# Patient Record
Sex: Female | Born: 1945 | Race: White | Hispanic: No | Smoking: Never smoker
Health system: Southern US, Community
[De-identification: ages and names within clinical notes are randomized; demographics above are authoritative.]

## PROBLEM LIST (undated history)

## (undated) DIAGNOSIS — M8580 Other specified disorders of bone density and structure, unspecified site: Secondary | ICD-10-CM

## (undated) DIAGNOSIS — M719 Bursopathy, unspecified: Secondary | ICD-10-CM

## (undated) DIAGNOSIS — D649 Anemia, unspecified: Secondary | ICD-10-CM

## (undated) DIAGNOSIS — F419 Anxiety disorder, unspecified: Secondary | ICD-10-CM

## (undated) DIAGNOSIS — Z8619 Personal history of other infectious and parasitic diseases: Secondary | ICD-10-CM

## (undated) DIAGNOSIS — C801 Malignant (primary) neoplasm, unspecified: Secondary | ICD-10-CM

## (undated) DIAGNOSIS — H269 Unspecified cataract: Secondary | ICD-10-CM

## (undated) DIAGNOSIS — K219 Gastro-esophageal reflux disease without esophagitis: Secondary | ICD-10-CM

## (undated) HISTORY — DX: Unspecified cataract: H26.9

## (undated) HISTORY — PX: CATARACT EXTRACTION: SUR2

## (undated) HISTORY — DX: Anemia, unspecified: D64.9

## (undated) HISTORY — DX: Anxiety disorder, unspecified: F41.9

## (undated) HISTORY — DX: Bursopathy, unspecified: M71.9

## (undated) HISTORY — PX: OTHER SURGICAL HISTORY: SHX169

## (undated) HISTORY — DX: Malignant (primary) neoplasm, unspecified: C80.1

## (undated) HISTORY — PX: DILATION AND CURETTAGE OF UTERUS: SHX78

## (undated) HISTORY — DX: Other specified disorders of bone density and structure, unspecified site: M85.80

## (undated) HISTORY — PX: BREAST LUMPECTOMY: SHX2

## (undated) HISTORY — DX: Personal history of other infectious and parasitic diseases: Z86.19

## (undated) HISTORY — PX: TUBAL LIGATION: SHX77

## (undated) HISTORY — DX: Gastro-esophageal reflux disease without esophagitis: K21.9

## (undated) HISTORY — PX: COLONOSCOPY: SHX174

---

## 1998-12-07 ENCOUNTER — Other Ambulatory Visit: Admission: RE | Admit: 1998-12-07 | Discharge: 1998-12-07 | Payer: Self-pay | Admitting: Obstetrics and Gynecology

## 1999-12-07 ENCOUNTER — Other Ambulatory Visit: Admission: RE | Admit: 1999-12-07 | Discharge: 1999-12-07 | Payer: Self-pay | Admitting: Obstetrics and Gynecology

## 2000-12-12 ENCOUNTER — Other Ambulatory Visit: Admission: RE | Admit: 2000-12-12 | Discharge: 2000-12-12 | Payer: Self-pay | Admitting: Obstetrics and Gynecology

## 2001-12-14 ENCOUNTER — Other Ambulatory Visit: Admission: RE | Admit: 2001-12-14 | Discharge: 2001-12-14 | Payer: Self-pay | Admitting: Obstetrics and Gynecology

## 2003-01-07 ENCOUNTER — Other Ambulatory Visit: Admission: RE | Admit: 2003-01-07 | Discharge: 2003-01-07 | Payer: Self-pay | Admitting: Obstetrics and Gynecology

## 2004-01-10 ENCOUNTER — Other Ambulatory Visit: Admission: RE | Admit: 2004-01-10 | Discharge: 2004-01-10 | Payer: Self-pay | Admitting: Obstetrics and Gynecology

## 2005-01-31 ENCOUNTER — Other Ambulatory Visit: Admission: RE | Admit: 2005-01-31 | Discharge: 2005-01-31 | Payer: Self-pay | Admitting: Obstetrics and Gynecology

## 2005-03-15 ENCOUNTER — Ambulatory Visit: Payer: Self-pay | Admitting: Internal Medicine

## 2005-03-21 ENCOUNTER — Ambulatory Visit: Payer: Self-pay | Admitting: Internal Medicine

## 2005-03-21 ENCOUNTER — Encounter (INDEPENDENT_AMBULATORY_CARE_PROVIDER_SITE_OTHER): Payer: Self-pay | Admitting: *Deleted

## 2006-02-03 ENCOUNTER — Other Ambulatory Visit: Admission: RE | Admit: 2006-02-03 | Discharge: 2006-02-03 | Payer: Self-pay | Admitting: Obstetrics and Gynecology

## 2007-02-11 ENCOUNTER — Other Ambulatory Visit: Admission: RE | Admit: 2007-02-11 | Discharge: 2007-02-11 | Payer: Self-pay | Admitting: Obstetrics and Gynecology

## 2008-02-15 ENCOUNTER — Ambulatory Visit: Payer: Self-pay | Admitting: Obstetrics and Gynecology

## 2008-02-15 ENCOUNTER — Other Ambulatory Visit: Admission: RE | Admit: 2008-02-15 | Discharge: 2008-02-15 | Payer: Self-pay | Admitting: Obstetrics and Gynecology

## 2008-02-15 ENCOUNTER — Encounter: Payer: Self-pay | Admitting: Obstetrics and Gynecology

## 2008-04-01 DIAGNOSIS — Z8619 Personal history of other infectious and parasitic diseases: Secondary | ICD-10-CM

## 2008-04-01 HISTORY — DX: Personal history of other infectious and parasitic diseases: Z86.19

## 2009-04-13 ENCOUNTER — Ambulatory Visit: Payer: Self-pay | Admitting: Obstetrics and Gynecology

## 2009-04-13 ENCOUNTER — Other Ambulatory Visit: Admission: RE | Admit: 2009-04-13 | Discharge: 2009-04-13 | Payer: Self-pay | Admitting: Obstetrics and Gynecology

## 2010-04-05 ENCOUNTER — Encounter (INDEPENDENT_AMBULATORY_CARE_PROVIDER_SITE_OTHER): Payer: Self-pay | Admitting: *Deleted

## 2010-05-03 NOTE — Letter (Signed)
Summary: Colonoscopy Date Change Letter  [facility]  [address] [place], [place] [address]   Phone: [phone]  Fax: [phone]      April 05, 2010 MRN: [id number]   [facility] [address] [place], [place]  [address]   Dear Debra Frederick,   Previously you were recommended to have a repeat colonoscopy around this time. Your chart was recently reviewed by Dr. Lina Sar of [facility]. Follow up colonoscopy is now recommended in December 2013. This revised recommendation is based on current, nationally recognized guidelines for colorectal cancer screening and polyp surveillance. These guidelines are endorsed by the American Cancer Society, [employer] on Colorectal Cancer as well as numerous other major medical organizations.  Please understand that our recommendation assumes that you do not have any new symptoms such as bleeding, a change in bowel habits, anemia, or significant abdominal discomfort. If you do have any concerning GI symptoms or want to discuss the guideline recommendations, please call to arrange an office visit at your earliest convenience. Otherwise we will keep you in our reminder system and contact you 1-2 months prior to the date listed above to schedule your next colonoscopy.  Thank you,  Hedwig Morton. Juanda Chance, M.D.  [facility] [phone]

## 2010-07-17 ENCOUNTER — Encounter (INDEPENDENT_AMBULATORY_CARE_PROVIDER_SITE_OTHER): Payer: Medicare Other | Admitting: Obstetrics and Gynecology

## 2010-07-17 ENCOUNTER — Other Ambulatory Visit: Payer: Self-pay | Admitting: Obstetrics and Gynecology

## 2010-07-17 ENCOUNTER — Other Ambulatory Visit (HOSPITAL_COMMUNITY)
Admission: RE | Admit: 2010-07-17 | Discharge: 2010-07-17 | Disposition: A | Discharge status: Home / Self Care | Payer: Medicare Other | Source: Ambulatory Visit | Attending: Obstetrics and Gynecology | Admitting: Obstetrics and Gynecology

## 2010-07-17 DIAGNOSIS — N951 Menopausal and female climacteric states: Secondary | ICD-10-CM

## 2010-07-17 DIAGNOSIS — M899 Disorder of bone, unspecified: Secondary | ICD-10-CM

## 2010-07-17 DIAGNOSIS — Z124 Encounter for screening for malignant neoplasm of cervix: Secondary | ICD-10-CM

## 2010-07-17 DIAGNOSIS — I10 Essential (primary) hypertension: Secondary | ICD-10-CM

## 2010-07-17 DIAGNOSIS — N952 Postmenopausal atrophic vaginitis: Secondary | ICD-10-CM

## 2010-12-05 ENCOUNTER — Other Ambulatory Visit: Payer: Self-pay | Admitting: Dermatology

## 2011-04-12 ENCOUNTER — Encounter: Payer: Self-pay | Admitting: Obstetrics and Gynecology

## 2011-10-22 ENCOUNTER — Encounter: Payer: Self-pay | Admitting: Emergency Medicine

## 2011-10-22 ENCOUNTER — Ambulatory Visit (INDEPENDENT_AMBULATORY_CARE_PROVIDER_SITE_OTHER): Payer: Medicare Other | Admitting: Emergency Medicine

## 2011-10-22 ENCOUNTER — Ambulatory Visit: Payer: Medicare Other

## 2011-10-22 ENCOUNTER — Other Ambulatory Visit: Payer: Self-pay

## 2011-10-22 VITALS — BP 137/84 | HR 65 | Temp 97.2°F | Resp 16 | Ht 68.5 in | Wt 160.0 lb

## 2011-10-22 DIAGNOSIS — R634 Abnormal weight loss: Secondary | ICD-10-CM

## 2011-10-22 DIAGNOSIS — M542 Cervicalgia: Secondary | ICD-10-CM

## 2011-10-22 DIAGNOSIS — Z0000 Encounter for general adult medical examination without abnormal findings: Secondary | ICD-10-CM

## 2011-10-22 DIAGNOSIS — E785 Hyperlipidemia, unspecified: Secondary | ICD-10-CM

## 2011-10-22 DIAGNOSIS — E559 Vitamin D deficiency, unspecified: Secondary | ICD-10-CM | POA: Insufficient documentation

## 2011-10-22 LAB — POCT UA - MICROSCOPIC ONLY
Bacteria, U Microscopic: NEGATIVE
Casts, Ur, LPF, POC: NEGATIVE
Crystals, Ur, HPF, POC: NEGATIVE
RBC, urine, microscopic: NEGATIVE

## 2011-10-22 LAB — CBC WITH DIFFERENTIAL/PLATELET
Basophils Absolute: 0.1 10*3/uL (ref 0.0–0.1)
Basophils Relative: 1 % (ref 0–1)
HCT: 40.8 % (ref 36.0–46.0)
MCHC: 34.3 g/dL (ref 30.0–36.0)
Monocytes Absolute: 0.3 10*3/uL (ref 0.1–1.0)
Neutro Abs: 2.8 10*3/uL (ref 1.7–7.7)
Platelets: 216 10*3/uL (ref 150–400)
RDW: 14.2 % (ref 11.5–15.5)

## 2011-10-22 LAB — COMPREHENSIVE METABOLIC PANEL
ALT: 13 U/L (ref 0–35)
BUN: 17 mg/dL (ref 6–23)
CO2: 24 mEq/L (ref 19–32)
Creat: 0.82 mg/dL (ref 0.50–1.10)
Glucose, Bld: 91 mg/dL (ref 70–99)
Total Bilirubin: 0.6 mg/dL (ref 0.3–1.2)

## 2011-10-22 LAB — POCT URINALYSIS DIPSTICK
Bilirubin, UA: NEGATIVE
Blood, UA: NEGATIVE
Ketones, UA: NEGATIVE
Spec Grav, UA: 1.02
pH, UA: 7

## 2011-10-22 LAB — LIPID PANEL
Cholesterol: 222 mg/dL — ABNORMAL HIGH (ref 0–200)
HDL: 60 mg/dL (ref >39)
Total CHOL/HDL Ratio: 3.7 Ratio
Triglycerides: 145 mg/dL (ref <150)

## 2011-10-22 LAB — TSH: TSH: 2.196 u[IU]/mL (ref 0.350–4.500)

## 2011-10-22 NOTE — Progress Notes (Signed)
  Subjective:    Patient ID: Debra Frederick, female    DOB: 1945/10/08, 66 y.o.   MRN: [id number]  HPI    Review of Systems     Objective:   Physical Exam  [facility] reading (PRIMARY) by  Dr Cleta Alberts no acute disease normal alignment no disc space disease        Assessment & Plan:

## 2011-10-22 NOTE — Progress Notes (Signed)
@UMFCLOGO @  Patient ID: Debra Frederick MRN: [id number], DOB: 02/24/1946, 66 y.o. Date of Encounter: 10/22/2011, 9:32 AM  Primary Physician: No primary provider on file.  Chief Complaint: Physical (CPE)  HPI: 66 y.o. y/o female with history of noted below here for CPE.  Doing well. No issues/complaints.  NWG:[id number]  Pap:2012 MMG: Review of Systems:  Consitutional: No fever, chills, fatigue, night sweats, lymphadenopathy, or weight changes. Eyes: No visual changes, eye redness, or discharge. ENT/Mouth: Ears: No otalgia, tinnitus, hearing loss, discharge. Nose: No congestion, rhinorrhea, sinus pain, or epistaxis. Throat: No sore throat, post nasal drip, or teeth pain. Cardiovascular: No CP, palpitations, diaphoresis, DOE, edema, orthopnea, PND. Respiratory: She has a history of exercise-induced asthma she has Dulera to take but rarely takes it. As a pro-air inhaler to use as  Gastrointestinal: No anorexia, dysphagia, reflux, pain, nausea, vomiting, hematemesis, diarrhea, constipation, BRBPR, or melena. Breast: No discharge, pain, swelling, or mass. Genitourinary: No dysuria, frequency, urgency, hematuria, incontinence, nocturia, amenorrhea, vaginal discharge, pruritis, burning, abnormal bleeding, or pain. Musculoskeletal: She has experienced some pain in the right side of her neck no radiation down the arm.  Skin: No rash, erythema, lesion changes, pain, warmth, jaundice, or pruritis. Neurological: No headache, dizziness, syncope, seizures, tremors, memory loss, coordination problems, or paresthesias. Psychological: She has had some difficulty with anxiety. When she does get anxious she treats this with exercise and we'll get the No  depression, hallucinations, SI/HI. Endocrine: No fatigue, polydipsia, polyphagia, polyuria, or known diabetes. All other systems were reviewed and are otherwise negative.  Past Medical History  Diagnosis Date  . Bursitis   . Vitamin d deficiency     . History of shingles 2010     Past Surgical History  Procedure Date  . Dilation and curettage of uterus   . Breast lumpectomy   . Tubal ligation     Home Meds:  Prior to Admission medications   Medication Sig Start Date End Date Taking? Authorizing Provider  calcium-vitamin D (OSCAL WITH D) 250-125 MG-UNIT per tablet Take 1 tablet by mouth daily.   Yes Historical Provider, MD  ergocalciferol (VITAMIN D2) 50000 UNITS capsule Take 50,000 Units by mouth once a week.   Yes Historical Provider, MD  fish oil-omega-3 fatty acids 1000 MG capsule Take 1 g by mouth daily.   Yes Historical Provider, MD  Flaxseed, Linseed, (FLAX SEED OIL PO) Take by mouth.   Yes Historical Provider, MD    Allergies:  Allergies  Allergen Reactions  . Penicillins     History   Social History  . Marital Status: Married    Spouse Name: N/A    Number of Children: N/A  . Years of Education: N/A   Occupational History  . Not on file.   Social History Main Topics  . Smoking status: Never Smoker   . Smokeless tobacco: Not on file  . Alcohol Use: Not on file  . Drug Use: Not on file  . Sexually Active: Not on file   Other Topics Concern  . Not on file   Social History Narrative  . No narrative on file    Family History  Problem Relation Age of Onset  . Cancer Mother   . Cancer Father     Physical Exam  Blood pressure 137/84, pulse 65, temperature 97.2 F (36.2 C), resp. rate 16, height 5' 8.5" (1.74 m), weight 160 lb (72.576 kg)., Body mass index is 23.97 kg/(m^2). General: Well developed, well nourished, in no acute distress. HEENT:  Normocephalic, atraumatic. Conjunctiva pink, sclera non-icteric. Pupils 2 mm constricting to 1 mm, round, regular, and equally reactive to light and accomodation. EOMI. Internal auditory canal clear. TMs with good cone of light and without pathology. Nasal mucosa pink. Nares are without discharge. No sinus tenderness. Oral mucosa pink. Dentition  normal.  Pharynx without exudate.   Neck: Supple. Trachea midline. No thyromegaly. Full ROM. No lymphadenopathy. Lungs: Clear to auscultation bilaterally without wheezes, rales, or rhonchi. Breathing is of normal effort and unlabored. Cardiovascular: RRR with S1 S2. No murmurs, rubs, or gallops appreciated. Distal pulses 2+ symmetrically. No carotid or abdominal bruits  Breast: Not examined    Abdomen: Soft, non-tender, non-distended with normoactive bowel sounds. No hepatosplenomegaly or masses. No rebound/guarding. No CVA tenderness. Without hernias.  Genitourinary:     Musculoskeletal: Full range of motion and 5/5 strength throughout. Without swelling, atrophy, tenderness, crepitus, or warmth. Extremities without clubbing, cyanosis, or edema. Calves supple. Skin: Warm and moist without erythema, ecchymosis, wounds, or rash. Neuro: A+Ox3. CN II-XII grossly intact. Moves all extremities spontaneously. Full sensation throughout. Normal gait. DTR 2+ throughout upper and lower extremities. Finger to nose intact. Psych:  Responds to questions appropriately with a normal affect.   The depression scale score of four     Assessment/Plan:  66 y.o. y/o female here for CPE. Patient is healthy she does not smoke or drink. She exercises regularly. She continues to work 15-20 hours a week. She is due to see her GYN doctor Dr. Eda Paschal in the next month.  -  Signed, Earl Lites, MD 10/22/2011 9:32 AM

## 2011-10-24 NOTE — Progress Notes (Signed)
Patient advised of lab results

## 2011-11-04 ENCOUNTER — Encounter: Payer: Self-pay | Admitting: Gynecology

## 2011-11-04 DIAGNOSIS — M8580 Other specified disorders of bone density and structure, unspecified site: Secondary | ICD-10-CM | POA: Insufficient documentation

## 2011-11-13 ENCOUNTER — Encounter: Payer: Self-pay | Admitting: Obstetrics and Gynecology

## 2011-11-13 ENCOUNTER — Ambulatory Visit (INDEPENDENT_AMBULATORY_CARE_PROVIDER_SITE_OTHER): Payer: Medicare Other | Admitting: Obstetrics and Gynecology

## 2011-11-13 VITALS — BP 130/84 | Ht 68.0 in | Wt 158.0 lb

## 2011-11-13 DIAGNOSIS — N952 Postmenopausal atrophic vaginitis: Secondary | ICD-10-CM

## 2011-11-13 DIAGNOSIS — R351 Nocturia: Secondary | ICD-10-CM

## 2011-11-13 DIAGNOSIS — Z780 Asymptomatic menopausal state: Secondary | ICD-10-CM

## 2011-11-13 DIAGNOSIS — M719 Bursopathy, unspecified: Secondary | ICD-10-CM | POA: Insufficient documentation

## 2011-11-13 DIAGNOSIS — M899 Disorder of bone, unspecified: Secondary | ICD-10-CM

## 2011-11-13 DIAGNOSIS — M8580 Other specified disorders of bone density and structure, unspecified site: Secondary | ICD-10-CM

## 2011-11-13 DIAGNOSIS — Z8619 Personal history of other infectious and parasitic diseases: Secondary | ICD-10-CM | POA: Insufficient documentation

## 2011-11-13 NOTE — Progress Notes (Signed)
Patient came to see me today for further followup. We have been watching her with osteopenia. She does not have an elevated FRAX risk. She has not had a fracture. Her last bone density was 2012. She is very mild menopausal symptoms. She is not symptomatic enough to go on HRT. She also had a mother who had breast cancer and that makes her reluctant to take estrogen. She is not aware of vaginal dryness. She is having no vaginal bleeding. She is having no pelvic pain. She is due for followup colonoscopy this year. She has always had normal Pap smears except one in the 90s when she had a colposcopy which was negative and she's had normal Paps since then. Her last Pap was last year. She does have nocturia 1-2 times per night. If she drinks a lot of liquids she did have some urgency as well. She does not have incontinence.  ROS: 12 systems review done. Pertinent positives above. Other positives include bursitis and shingles.  Physical examination: Debra Frederick present. HEENT within normal limits. Neck: Thyroid not large. No masses. Supraclavicular nodes: not enlarged. Breasts: Examined in both sitting and lying  position. No skin changes and no masses. Abdomen: Soft no guarding rebound or masses or hernia. Pelvic: External: Within normal limits. BUS: Within normal limits. Vaginal:within normal limits. Poor  estrogen effect. No evidence of cystocele rectocele or enterocele. Cervix: clean. Uterus: Normal size and shape. Adnexa: No masses. Rectovaginal exam: Confirmatory and negative. Extremities: Within normal limits.  Assessment: #1. Osteopenia #2. Asymptomatic atrophic vaginitis #3. Nocturia with occasional urgency  Plan: Continue periodic bone densities. Continue yearly mammograms. Observation of urinary symptoms.The new Pap smear guidelines were discussed with the patient. No pap done.

## 2011-11-13 NOTE — Patient Instructions (Signed)
Schedule bone density in 2014. 

## 2011-11-20 ENCOUNTER — Encounter: Payer: Self-pay | Admitting: Obstetrics and Gynecology

## 2012-01-02 ENCOUNTER — Encounter: Payer: Self-pay | Admitting: *Deleted

## 2012-03-02 ENCOUNTER — Ambulatory Visit: Payer: Medicare Other

## 2012-03-02 ENCOUNTER — Ambulatory Visit (INDEPENDENT_AMBULATORY_CARE_PROVIDER_SITE_OTHER): Payer: Medicare Other | Admitting: Emergency Medicine

## 2012-03-02 VITALS — BP 139/85 | HR 86 | Temp 97.8°F | Resp 16 | Ht 69.0 in | Wt 159.8 lb

## 2012-03-02 DIAGNOSIS — M79609 Pain in unspecified limb: Secondary | ICD-10-CM

## 2012-03-02 DIAGNOSIS — J029 Acute pharyngitis, unspecified: Secondary | ICD-10-CM

## 2012-03-02 DIAGNOSIS — M79605 Pain in left leg: Secondary | ICD-10-CM

## 2012-03-02 MED ORDER — FIRST-DUKES MOUTHWASH MT SUSP: OROMUCOSAL | Status: DC

## 2012-03-02 NOTE — Progress Notes (Signed)
  Subjective:    Patient ID: Debra Frederick, female    DOB: January 15, 1946, 66 y.o.   MRN: [id number]  HPI patient states she has had some soreness in her leg for the last few weeks. She walks on a regular basis. She recently went to [place] and [place] and walked consistently while she was there on her trip. She now has pain in the upper lateral portion of her left leg she has significant difficulty when she tries to walk.   Review of Systems she is a non smoker. Patient also complaining of chronic sore throat for which he uses a Dukes mouthwash with good relief..     Objective:   Physical Exam examination left leg reveals no calf swelling. There is tenderness just lateral to the tibia over the midshaft of the fibula. There is no posterior calf tenderness. There is a negative Homans sign. There are a few varicosities present.  [facility] reading (PRIMARY) by  Dr Cleta Alberts no fracture seen       Assessment & Plan:  Go ahead and check films of the left tibia and fibula.  I do not see a stress fracture on her films but her exam is stable as a stress injury to the fibula. We'll place the patient on crutches .appointment to be made with orthopedist. She will continue on nonsteroidals.

## 2012-03-16 ENCOUNTER — Other Ambulatory Visit: Payer: Self-pay | Admitting: Radiology

## 2012-03-27 ENCOUNTER — Encounter: Payer: Self-pay | Admitting: Internal Medicine

## 2012-11-26 ENCOUNTER — Encounter: Payer: Self-pay | Admitting: Internal Medicine

## 2013-04-01 HISTORY — PX: COLONOSCOPY: SHX174

## 2013-05-07 ENCOUNTER — Other Ambulatory Visit: Payer: Self-pay | Admitting: *Deleted

## 2013-05-07 DIAGNOSIS — M8580 Other specified disorders of bone density and structure, unspecified site: Secondary | ICD-10-CM

## 2013-05-10 ENCOUNTER — Encounter: Payer: Self-pay | Admitting: Gynecology

## 2013-05-12 ENCOUNTER — Other Ambulatory Visit: Payer: Self-pay | Admitting: *Deleted

## 2013-05-12 DIAGNOSIS — R928 Other abnormal and inconclusive findings on diagnostic imaging of breast: Secondary | ICD-10-CM

## 2013-05-13 ENCOUNTER — Encounter: Payer: Self-pay | Admitting: Emergency Medicine

## 2013-05-13 ENCOUNTER — Ambulatory Visit (INDEPENDENT_AMBULATORY_CARE_PROVIDER_SITE_OTHER): Payer: Medicare Other | Admitting: Emergency Medicine

## 2013-05-13 ENCOUNTER — Encounter: Payer: Self-pay | Admitting: Internal Medicine

## 2013-05-13 ENCOUNTER — Telehealth: Payer: Self-pay | Admitting: Gynecology

## 2013-05-13 ENCOUNTER — Other Ambulatory Visit: Payer: Self-pay | Admitting: *Deleted

## 2013-05-13 VITALS — BP 124/78 | HR 74 | Temp 97.9°F | Resp 16 | Ht 68.5 in | Wt 171.0 lb

## 2013-05-13 DIAGNOSIS — M8580 Other specified disorders of bone density and structure, unspecified site: Secondary | ICD-10-CM

## 2013-05-13 DIAGNOSIS — R9431 Abnormal electrocardiogram [ECG] [EKG]: Secondary | ICD-10-CM

## 2013-05-13 DIAGNOSIS — Z1211 Encounter for screening for malignant neoplasm of colon: Secondary | ICD-10-CM

## 2013-05-13 DIAGNOSIS — Z0000 Encounter for general adult medical examination without abnormal findings: Secondary | ICD-10-CM

## 2013-05-13 DIAGNOSIS — R928 Other abnormal and inconclusive findings on diagnostic imaging of breast: Secondary | ICD-10-CM

## 2013-05-13 DIAGNOSIS — Z139 Encounter for screening, unspecified: Secondary | ICD-10-CM

## 2013-05-13 LAB — CBC WITH DIFFERENTIAL/PLATELET
BASOS ABS: 0.1 10*3/uL (ref 0.0–0.1)
Basophils Relative: 1 % (ref 0–1)
EOS PCT: 3 % (ref 0–5)
Eosinophils Absolute: 0.1 10*3/uL (ref 0.0–0.7)
HCT: 40.9 % (ref 36.0–46.0)
Hemoglobin: 13.8 g/dL (ref 12.0–15.0)
LYMPHS ABS: 1.8 10*3/uL (ref 0.7–4.0)
LYMPHS PCT: 33 % (ref 12–46)
MCH: 28.3 pg (ref 26.0–34.0)
MCHC: 33.7 g/dL (ref 30.0–36.0)
MCV: 83.8 fL (ref 78.0–100.0)
Monocytes Absolute: 0.4 10*3/uL (ref 0.1–1.0)
Monocytes Relative: 8 % (ref 3–12)
NEUTROS PCT: 55 % (ref 43–77)
Neutro Abs: 3 10*3/uL (ref 1.7–7.7)
PLATELETS: 224 10*3/uL (ref 150–400)
RBC: 4.88 MIL/uL (ref 3.87–5.11)
RDW: 13.8 % (ref 11.5–15.5)
WBC: 5.3 10*3/uL (ref 4.0–10.5)

## 2013-05-13 LAB — COMPLETE METABOLIC PANEL WITH GFR
ALK PHOS: 78 U/L (ref 39–117)
ALT: 9 U/L (ref 0–35)
AST: 14 U/L (ref 0–37)
Albumin: 4.3 g/dL (ref 3.5–5.2)
BUN: 19 mg/dL (ref 6–23)
CALCIUM: 9.4 mg/dL (ref 8.4–10.5)
CO2: 23 mEq/L (ref 19–32)
CREATININE: 0.84 mg/dL (ref 0.50–1.10)
Chloride: 106 mEq/L (ref 96–112)
GFR, Est African American: 82 mL/min
GFR, Est Non African American: 71 mL/min
Glucose, Bld: 98 mg/dL (ref 70–99)
Potassium: 4 mEq/L (ref 3.5–5.3)
Sodium: 140 mEq/L (ref 135–145)
Total Bilirubin: 0.6 mg/dL (ref 0.2–1.2)
Total Protein: 6.8 g/dL (ref 6.0–8.3)

## 2013-05-13 LAB — POCT URINALYSIS DIPSTICK
Bilirubin, UA: NEGATIVE
Blood, UA: NEGATIVE
Glucose, UA: NEGATIVE
Ketones, UA: NEGATIVE
Leukocytes, UA: NEGATIVE
Nitrite, UA: NEGATIVE
PH UA: 6
Protein, UA: NEGATIVE
Spec Grav, UA: 1.025
UROBILINOGEN UA: 0.2

## 2013-05-13 LAB — LIPID PANEL
CHOL/HDL RATIO: 2.7 ratio
Cholesterol: 193 mg/dL (ref 0–200)
HDL: 72 mg/dL (ref >39)
LDL CALC: 102 mg/dL — AB (ref 0–99)
TRIGLYCERIDES: 96 mg/dL (ref <150)
VLDL: 19 mg/dL (ref 0–40)

## 2013-05-13 LAB — TSH: TSH: 2.24 u[IU]/mL (ref 0.350–4.500)

## 2013-05-13 NOTE — Progress Notes (Deleted)
   Subjective:    Patient ID: Debra Frederick, female    DOB: 04/30/1945, 67 y.o.   MRN: [id number]  HPI    Review of Systems     Objective:   Physical Exam        Assessment & Plan:   

## 2013-05-13 NOTE — Patient Instructions (Signed)
You will get a call from cardiology regarding appointment. You should make appointment with the dermatologist, Dr Ronnald Ramp. See Dr Phineas Real as scheduled. You will also get a call from Dr Olevia Perches regarding your colonoscopy. We will call you in the next week regarding your lab results. Bring [place] back the stool cards and these can be resulted by the lab.

## 2013-05-13 NOTE — Telephone Encounter (Signed)
Pt informed with the below note. 

## 2013-05-13 NOTE — Telephone Encounter (Signed)
Tell patient bone density did show some loss of bone. Not to the degree that I would recommend taking a medication to treat it. I would make sure that she has had a recent vitamin D level to make sure that she is getting enough vitamin D.

## 2013-05-13 NOTE — Progress Notes (Signed)
   Subjective:    Patient ID: Debra Frederick, female    DOB: 1945-09-03, 68 y.o.   MRN: [id number]  HPI    Review of Systems  HENT: Positive for dental problem and mouth sores.   Musculoskeletal: Positive for back pain.       Objective:   Physical Exam        Assessment & Plan:

## 2013-05-13 NOTE — Progress Notes (Signed)
@[facility]GO @  Patient ID: Debra Frederick MRN: [id number], DOB: 11/25/1945, 68 y.o. Date of Encounter: 05/13/2013, 10:21 AM  Primary Physician: Jenny Reichmann, MD  Chief Complaint: Physical (CPE)  HPI: 68 y.o. y/o female with history of noted below here for CPE.  Doing well. No issues/complaints.  No LMP recorded. Patient is postmenopausal. Pap: 07/2010 normal , patient has appointment scheduled with Dr Phineas Real, next week, as Dr Ashley Murrain has retired. MMG: done last week, was called back for additional imaging, additional views performed with Mammogram and US done, was advised these were normal Review of Systems:  Consitutional: No fever, chills, fatigue, night sweats, lymphadenopathy, or weight changes. Eyes: No visual changes, eye redness, or discharge. ENT/Mouth: Ears: No otalgia, tinnitus, hearing loss, discharge. Nose: No congestion, rhinorrhea, sinus pain, or epistaxis. Throat: No sore throat, post nasal drip, or teeth pain. Cardiovascular: No CP, palpitations, diaphoresis, DOE, edema, orthopnea, PND. Respiratory: No shortness of breath, no wheezing, no cough. Gastrointestinal: No anorexia, dysphagia, reflux, pain, nausea, vomiting, hematemesis, diarrhea, constipation, BRBPR, or melena. Breast: No discharge, pain, swelling, or mass. Genitourinary: No dysuria, frequency, urgency, hematuria, incontinence, nocturia, amenorrhea, vaginal discharge, pruritis, burning, abnormal bleeding, or pain. Musculoskeletal: No decreased ROM, myalgias, stiffness, joint swelling, or weakness. Skin: No rash, erythema, lesion changes, pain, warmth, jaundice, or pruritis. Neurological: No headache, dizziness, syncope, seizures, tremors, memory loss, coordination problems, or paresthesias. Psychological: No anxiety, depression, hallucinations, SI/HI. Endocrine: No fatigue, polydipsia, polyphagia, polyuria, or known diabetes. All other systems were reviewed and are otherwise negative.  Past Medical History   Diagnosis Date  . Bursitis   . Vitamin D deficiency   . History of shingles 2010  . Osteopenia   . Cataract      Past Surgical History  Procedure Laterality Date  . Dilation and curettage of uterus    . Tubal ligation    . Breast lumpectomy      Benign  . Basal cell excised      Home Meds:  Prior to Admission medications   Medication Sig Start Date End Date Taking? Authorizing Provider  calcium-vitamin D (OSCAL WITH D) 250-125 MG-UNIT per tablet Take 1 tablet by mouth daily.   Yes Historical Provider, MD  Cholecalciferol (VITAMIN D PO) Take by mouth.   Yes Historical Provider, MD  Diphenhyd-Hydrocort-Nystatin (FIRST-DUKES MOUTHWASH) SUSP Please prepare this dukes mouthwash with the original formula which included tetracycline . 03/02/12  Yes Darlyne Russian, MD  fish oil-omega-3 fatty acids 1000 MG capsule Take 1 g by mouth daily.   Yes Historical Provider, MD  Flaxseed, Linseed, (FLAX SEED OIL PO) Take by mouth.   Yes Historical Provider, MD  vitamin C (ASCORBIC ACID) 500 MG tablet Take 500 mg by mouth daily.   Yes Historical Provider, MD    Allergies:  Allergies  Allergen Reactions  . Penicillins     History   Social History  . Marital Status: Married    Spouse Name: N/A    Number of Children: N/A  . Years of Education: N/A   Occupational History  . Not on file.   Social History Main Topics  . Smoking status: Never Smoker   . Smokeless tobacco: Not on file  . Alcohol Use: Not on file     Comment: Rare  . Drug Use: No  . Sexual Activity: Yes    Birth Control/ Protection: Surgical, Other-see comments   Other Topics Concern  . Not on file   Social History Narrative  . No narrative on  file    Family History  Problem Relation Age of Onset  . Breast cancer Mother     Age 62  . Cancer Mother     breast cancer  . Liver cancer Father   . Cancer Father     liver cancer  . Stroke Maternal Grandmother     Physical Exam  Blood pressure 124/78, pulse 74,  temperature 97.9 F (36.6 C), resp. rate 16, height 5' 8.5" (1.74 m), weight 171 lb (77.565 kg), SpO2 96.00%., Body mass index is 25.62 kg/(m^2). General: Well developed, well nourished, in no acute distress. HEENT: Normocephalic, atraumatic. Conjunctiva pink, sclera non-icteric. Pupils 2 mm constricting to 1 mm, round, regular, and equally reactive to light and accomodation. EOMI. Internal auditory canal clear. TMs with good cone of light and without pathology. Nasal mucosa pink. Nares are without discharge. No sinus tenderness. Oral mucosa pink. Dentition patient wearing partial. Pharynx without exudate.   Neck: Supple. Trachea midline. No thyromegaly. Full ROM. No lymphadenopathy. Lungs: Clear to auscultation bilaterally without wheezes, rales, or rhonchi. Breathing is of normal effort and unlabored. Cardiovascular: RRR with S1 S2. No murmurs, rubs, or gallops appreciated. Distal pulses 2+ symmetrically. No carotid or abdominal bruits.  Breast: Breast exam was deferred. Patient had her mammograms yesterday which were normal and she is due to see Dr. Phineas Real in the near future.  Abdomen: Soft, non-tender, non-distended with normoactive bowel sounds. No hepatosplenomegaly or masses. No rebound/guarding. No CVA tenderness. Without hernias.  Genitourinary: Pelvic examination was deferred Musculoskeletal: Full range of motion and 5/5 strength throughout. Without swelling, atrophy, tenderness, crepitus, or warmth. Extremities without clubbing, cyanosis, or edema. Calves supple. Skin: Warm and moist without erythema, ecchymosis, wounds, or rash. Neuro: A+Ox3. CN II-XII grossly intact. Moves all extremities spontaneously. Full sensation throughout. Normal gait. DTR 2+ throughout upper and lower extremities. Finger to nose intact. Psych:  Responds to questions appropriately with a normal affect.   EKG there is mild junctional depression otherwise normal     Assessment/Plan:  68 y.o. y/o female here  for CPE Patient will see Dr Ronnald Ramp for dermatology, skin cancer screening.  To see Dr Oswald Hillock Will see Dr Olevia Perches for colonoscopy, this is  Patient has some EKG changes, consider cardiology referral, she has no family history of heart disease. Denies any chest pain or shortness of breath. Patient does go to the gym and participates in yoga classes.  Will continue with this activity.    Signed, Nena Jordan, MD 05/13/2013 10:21 AM

## 2013-05-13 NOTE — Progress Notes (Signed)
   Subjective:    Patient ID: Debra Frederick, female    DOB: 1945-05-13, 68 y.o.   MRN: [id number]  HPI    Review of Systems     Objective:   Physical Exam        Assessment & Plan:

## 2013-05-17 LAB — IFOBT (OCCULT BLOOD): IFOBT: NEGATIVE

## 2013-05-27 ENCOUNTER — Encounter: Payer: Medicare Other | Admitting: Gynecology

## 2013-06-16 ENCOUNTER — Encounter: Payer: Self-pay | Admitting: Emergency Medicine

## 2013-06-22 ENCOUNTER — Ambulatory Visit (INDEPENDENT_AMBULATORY_CARE_PROVIDER_SITE_OTHER): Payer: Medicare Other | Admitting: Gynecology

## 2013-06-22 ENCOUNTER — Other Ambulatory Visit (HOSPITAL_COMMUNITY)
Admission: RE | Admit: 2013-06-22 | Discharge: 2013-06-22 | Disposition: A | Discharge status: Home / Self Care | Payer: Medicare Other | Source: Ambulatory Visit | Attending: Gynecology | Admitting: Gynecology

## 2013-06-22 ENCOUNTER — Encounter: Payer: Self-pay | Admitting: Gynecology

## 2013-06-22 VITALS — BP 122/76 | Ht 68.0 in | Wt 174.0 lb

## 2013-06-22 DIAGNOSIS — M8580 Other specified disorders of bone density and structure, unspecified site: Secondary | ICD-10-CM

## 2013-06-22 DIAGNOSIS — Z124 Encounter for screening for malignant neoplasm of cervix: Secondary | ICD-10-CM

## 2013-06-22 DIAGNOSIS — N952 Postmenopausal atrophic vaginitis: Secondary | ICD-10-CM

## 2013-06-22 DIAGNOSIS — M899 Disorder of bone, unspecified: Secondary | ICD-10-CM

## 2013-06-22 DIAGNOSIS — M949 Disorder of cartilage, unspecified: Secondary | ICD-10-CM

## 2013-06-22 NOTE — Patient Instructions (Signed)
Followup in one year for annual exam, sooner as needed. 

## 2013-06-22 NOTE — Progress Notes (Signed)
PAMLEA FINDER 03-Jul-1945 [id number]        68 y.o.  [id number] for followup exam.  Former patient of Dr. Cherylann Banas. Several issues noted below.  Past medical history,surgical history, problem list, medications, allergies, family history and social history were all reviewed and documented in the EPIC chart.  ROS:  Performed and pertinent positives and negatives are included in the history, assessment and plan .  Exam: Kim assistant Filed Vitals:   06/22/13 1458  BP: 122/76  Height: 5\' 8"  (1.727 m)  Weight: 174 lb (78.926 kg)   General appearance  Normal Skin grossly normal Head/Neck normal with no cervical or supraclavicular adenopathy thyroid normal Lungs  clear Cardiac RR, without RMG Abdominal  soft, nontender, without masses, organomegaly or hernia Breasts  examined lying and sitting without masses, retractions, discharge or axillary adenopathy. Pelvic  Ext/BUS/vagina with generalized atrophic changes  Cervix with atrophic changes. Pap done  Uterus anteverted, normal size, shape and contour, midline and mobile nontender   Adnexa  Without masses or tenderness    Anus and perineum  Normal   Rectovaginal  Normal sphincter tone without palpated masses or tenderness.    Assessment/Plan:  68 y.o. [id number] female for annual exam.   1. Postmenopausal/atrophic genital changes. Patient doing well without significant hot flashes, night sweats, vaginal dryness or dyspareunia. No vaginal bleeding. Will continue to monitor. Report any vaginal bleeding. 2. Osteopenia. DEXA 05/2013 T score -2.1. FRAX 12%/2.5%. Statistical significant decline at spine and hip. Check vitamin D level today. Increase calcium vitamin D reviewed. Repeat DEXA at 2 year interval. 3. Pap smear 2012. Pap smear done today. History of slight atypia 1991 with colposcopic biopsies negative. All other Pap smears normal. Reviewed current screening guidelines and options to stop screening altogether she is over the age of 24  reviewed. Patient uncomfortable with this and prefers to continue to be screened. 4. Mammography 05/2013. Continue with annual mammography. SBE monthly reviewed. 5. Colonoscopy overdue now patient knows to schedule. She is in the process of arranging. 6. Health maintenance. No routine blood work done as it is all done through her primary physician's office. Followup one year, sooner as needed.   Note: This document was prepared with digital dictation and possible smart phrase technology. Any transcriptional errors that result from this process are unintentional.   [name], 3:47 PM 06/22/2013

## 2013-06-23 LAB — VITAMIN D 25 HYDROXY (VIT D DEFICIENCY, FRACTURES): VIT D 25 HYDROXY: 59 ng/mL (ref 30–89)

## 2013-06-23 LAB — URINALYSIS W MICROSCOPIC + REFLEX CULTURE
Bacteria, UA: NONE SEEN
Bilirubin Urine: NEGATIVE
Casts: NONE SEEN
Glucose, UA: NEGATIVE mg/dL
Hgb urine dipstick: NEGATIVE
Ketones, ur: NEGATIVE mg/dL
LEUKOCYTES UA: NEGATIVE
NITRITE: NEGATIVE
PH: 5.5 (ref 5.0–8.0)
PROTEIN: NEGATIVE mg/dL
Specific Gravity, Urine: 1.02 (ref 1.005–1.030)
Squamous Epithelial / LPF: NONE SEEN
Urobilinogen, UA: 0.2 mg/dL (ref 0.0–1.0)

## 2013-07-01 ENCOUNTER — Ambulatory Visit (AMBULATORY_SURGERY_CENTER): Payer: Self-pay | Admitting: *Deleted

## 2013-07-01 VITALS — Ht 68.0 in | Wt 174.4 lb

## 2013-07-01 DIAGNOSIS — Z1211 Encounter for screening for malignant neoplasm of colon: Secondary | ICD-10-CM

## 2013-07-01 MED ORDER — MOVIPREP 100 G PO SOLR: ORAL | Status: DC

## 2013-07-01 NOTE — Progress Notes (Signed)
No egg or soy allergy  No home oxygen use or problems with anesthesia

## 2013-07-05 ENCOUNTER — Encounter: Payer: Self-pay | Admitting: Internal Medicine

## 2013-07-16 ENCOUNTER — Ambulatory Visit (AMBULATORY_SURGERY_CENTER): Payer: Medicare Other | Admitting: Internal Medicine

## 2013-07-16 ENCOUNTER — Encounter: Payer: Self-pay | Admitting: Internal Medicine

## 2013-07-16 VITALS — BP 111/63 | HR 62 | Temp 98.0°F | Resp 18 | Ht 69.0 in | Wt 174.0 lb

## 2013-07-16 DIAGNOSIS — D126 Benign neoplasm of colon, unspecified: Secondary | ICD-10-CM

## 2013-07-16 DIAGNOSIS — Z1211 Encounter for screening for malignant neoplasm of colon: Secondary | ICD-10-CM

## 2013-07-16 MED ORDER — SODIUM CHLORIDE 0.9 % IV SOLN: 500.0000 mL | INTRAVENOUS | Status: DC

## 2013-07-16 NOTE — Patient Instructions (Signed)
Discharge instructions given with verbal understanding. Handout on polyp. Resume previous medications. YOU HAD AN ENDOSCOPIC PROCEDURE TODAY AT [facility]: Refer to the procedure report that was given to you for any specific questions about what was found during the examination.  If the procedure report does not answer your questions, please call your gastroenterologist to clarify.  If you requested that your care partner not be given the details of your procedure findings, then the procedure report has been included in a sealed envelope for you to review at your convenience later.  YOU SHOULD EXPECT: Some feelings of bloating in the abdomen. Passage of more gas than usual.  Walking can help get rid of the air that was put into your GI tract during the procedure and reduce the bloating. If you had a lower endoscopy (such as a colonoscopy or flexible sigmoidoscopy) you may notice spotting of blood in your stool or on the toilet paper. If you underwent a bowel prep for your procedure, then you may not have a normal bowel movement for a few days.  DIET: Your first meal following the procedure should be a light meal and then it is ok to progress to your normal diet.  A half-sandwich or bowl of soup is an example of a good first meal.  Heavy or fried foods are harder to digest and may make you feel nauseous or bloated.  Likewise meals heavy in dairy and vegetables can cause extra gas to form and this can also increase the bloating.  Drink plenty of fluids but you should avoid alcoholic beverages for 24 hours.  ACTIVITY: Your care partner should take you home directly after the procedure.  You should plan to take it easy, moving slowly for the rest of the day.  You can resume normal activity the day after the procedure however you should NOT DRIVE or use heavy machinery for 24 hours (because of the sedation medicines used during the test).    SYMPTOMS TO REPORT IMMEDIATELY: A  gastroenterologist can be reached at any hour.  During normal business hours, 8:30 AM to 5:00 PM Monday through Friday, call [phone].  After hours and on weekends, please call the GI answering service at [phone] who will take a message and have the physician on call contact you.   Following lower endoscopy (colonoscopy or flexible sigmoidoscopy):  Excessive amounts of blood in the stool  Significant tenderness or worsening of abdominal pains  Swelling of the abdomen that is new, acute  Fever of 100F or higher FOLLOW UP: If any biopsies were taken you will be contacted by phone or by letter within the next 1-3 weeks.  Call your gastroenterologist if you have not heard about the biopsies in 3 weeks.  Our staff will call the home number listed on your records the next business day following your procedure to check on you and address any questions or concerns that you may have at that time regarding the information given to you following your procedure. This is a courtesy call and so if there is no answer at the home number and we have not heard from you through the emergency physician on call, we will assume that you have returned to your regular daily activities without incident.  SIGNATURES/CONFIDENTIALITY: You and/or your care partner have signed paperwork which will be entered into your electronic medical record.  These signatures attest to the fact that that the information above on your After Visit Summary has been reviewed  and is understood.  Full responsibility of the confidentiality of this discharge information lies with you and/or your care-partner. 

## 2013-07-16 NOTE — Op Note (Signed)
[place]  [address] [place], [id number]   COLONOSCOPY PROCEDURE REPORT  PATIENT: Debra Frederick, Debra Frederick  MR#: [id number] BIRTHDATE: 08/12/1945 , 53  yrs. old GENDER: Female ENDOSCOPIST: Lafayette Dragon, MD REFERRED [id number] Everlene Farrier, M.D. PROCEDURE DATE:  07/16/2013 PROCEDURE:   Colonoscopy with cold biopsy polypectomy First Screening Colonoscopy - Avg.  risk and is 50 yrs.  old or older - No.  Prior Negative Screening - Now for repeat screening. N/A  History of Adenoma - Now for follow-up colonoscopy & has been > or = to 3 yrs.  N/A  Polyps Removed Today? Yes. ASA CLASS:   Class II INDICATIONS:colonoscopy in December 2006 showed hyperplastic polyp.  MEDICATIONS: MAC sedation, administered by CRNA and propofol (Diprivan) 250mg  IV  DESCRIPTION OF PROCEDURE:   After the risks benefits and alternatives of the procedure were thoroughly explained, informed consent was obtained.  A digital rectal exam revealed no abnormalities of the rectum.   The LB [id number]  endoscope was introduced through the anus and advanced to the cecum, which was identified by both the appendix and ileocecal valve. No adverse events experienced.   The quality of the prep was good, using MoviPrep  The instrument was then slowly withdrawn as the colon was fully examined.      COLON FINDINGS: A diminutive sessile polyp was found in the sigmoid colon.at 20 cm  A polypectomy was performed with cold forceps.  The resection was complete and the polyp tissue was completely retrieved.  Retroflexed views revealed no abnormalities. The time to cecum=4 minutes 03 seconds.  Withdrawal time=11 minutes 16 seconds.  The scope was withdrawn and the procedure completed. COMPLICATIONS: There were no complications.  ENDOSCOPIC IMPRESSION: Diminutive sessile polyp was found in the sigmoid colon; polypectomy was performed with cold forceps  RECOMMENDATIONS: high fiber diet Recall colonoscopy in 10  years   eSigned:  Lafayette Dragon, MD 07/16/2013 9:13 AM   cc:   PATIENT NAME:  [name], [name] MR#: [id number]

## 2013-07-16 NOTE — Progress Notes (Signed)
Called to room to assist during endoscopic procedure.  Patient ID and intended procedure confirmed with present staff. Received instructions for my participation in the procedure from the performing physician.  

## 2013-07-19 ENCOUNTER — Telehealth: Payer: Self-pay

## 2013-07-19 NOTE — Telephone Encounter (Signed)
Left message on answering machine. 

## 2013-07-21 ENCOUNTER — Encounter: Payer: Self-pay | Admitting: Internal Medicine

## 2013-12-31 ENCOUNTER — Encounter: Payer: Self-pay | Admitting: Internal Medicine

## 2014-01-31 ENCOUNTER — Encounter: Payer: Self-pay | Admitting: Internal Medicine

## 2014-05-30 ENCOUNTER — Encounter: Payer: Self-pay | Admitting: *Deleted

## 2014-06-03 DIAGNOSIS — H04123 Dry eye syndrome of bilateral lacrimal glands: Secondary | ICD-10-CM | POA: Diagnosis not present

## 2014-06-03 DIAGNOSIS — H524 Presbyopia: Secondary | ICD-10-CM | POA: Diagnosis not present

## 2014-06-03 DIAGNOSIS — H16103 Unspecified superficial keratitis, bilateral: Secondary | ICD-10-CM | POA: Diagnosis not present

## 2014-06-03 DIAGNOSIS — H2513 Age-related nuclear cataract, bilateral: Secondary | ICD-10-CM | POA: Diagnosis not present

## 2014-06-07 DIAGNOSIS — Z1231 Encounter for screening mammogram for malignant neoplasm of breast: Secondary | ICD-10-CM | POA: Diagnosis not present

## 2014-06-07 DIAGNOSIS — Z803 Family history of malignant neoplasm of breast: Secondary | ICD-10-CM | POA: Diagnosis not present

## 2014-06-08 ENCOUNTER — Encounter: Payer: Self-pay | Admitting: Gynecology

## 2014-07-05 DIAGNOSIS — H25812 Combined forms of age-related cataract, left eye: Secondary | ICD-10-CM | POA: Diagnosis not present

## 2014-07-05 DIAGNOSIS — H2512 Age-related nuclear cataract, left eye: Secondary | ICD-10-CM | POA: Diagnosis not present

## 2014-07-05 DIAGNOSIS — H25012 Cortical age-related cataract, left eye: Secondary | ICD-10-CM | POA: Diagnosis not present

## 2014-09-21 ENCOUNTER — Encounter: Payer: Self-pay | Admitting: Gynecology

## 2014-09-21 ENCOUNTER — Ambulatory Visit (INDEPENDENT_AMBULATORY_CARE_PROVIDER_SITE_OTHER): Payer: Medicare Other | Admitting: Gynecology

## 2014-09-21 VITALS — BP 136/84 | Ht 69.0 in | Wt 169.0 lb

## 2014-09-21 DIAGNOSIS — Z01419 Encounter for gynecological examination (general) (routine) without abnormal findings: Secondary | ICD-10-CM | POA: Diagnosis not present

## 2014-09-21 DIAGNOSIS — N816 Rectocele: Secondary | ICD-10-CM

## 2014-09-21 DIAGNOSIS — M8580 Other specified disorders of bone density and structure, unspecified site: Secondary | ICD-10-CM | POA: Diagnosis not present

## 2014-09-21 DIAGNOSIS — N952 Postmenopausal atrophic vaginitis: Secondary | ICD-10-CM | POA: Diagnosis not present

## 2014-09-21 NOTE — Progress Notes (Signed)
Debra Frederick 1945/12/21 [id number]        69 y.o.  [id number] for breast and pelvic exam. Several issues noted below.  Past medical history,surgical history, problem list, medications, allergies, family history and social history were all reviewed and documented as reviewed in the EPIC chart.  ROS:  Performed with pertinent positives and negatives included in the history, assessment and plan.   Additional significant findings :  none   Exam: Kim Counsellor Vitals:   09/21/14 1403  BP: 136/84  Height: 5\' 9"  (0.712 m)  Weight: 169 lb (76.658 kg)   General appearance:  Normal affect, orientation and appearance. Skin: Grossly normal HEENT: Without gross lesions.  No cervical or supraclavicular adenopathy. Thyroid normal.  Lungs:  Clear without wheezing, rales or rhonchi Cardiac: RR, without RMG Abdominal:  Soft, nontender, without masses, guarding, rebound, organomegaly or hernia Breasts:  Examined lying and sitting without masses, retractions, discharge or axillary adenopathy. Pelvic:  Ext/BUS/vagina with atrophic changes. First-degree rectocele noted.  Cervix with atrophic changes  Uterus anteverted, normal size, shape and contour, midline and mobile nontender   Adnexa  Without masses or tenderness    Anus and perineum  Normal   Rectovaginal  Normal sphincter tone without palpated masses or tenderness.    Assessment/Plan:  69 y.o. [id number] female for breast and pelvic exam.   1. First-degree rectocele. Never noted before. Patient asymptomatic without pressure or stool trapping. Discussed with patient. She'll monitor for symptoms and otherwise follow up in one year for reexamination. Sooner if any issues. 2. Postmenopausal/atrophic genital changes. Patient without significant symptoms of hot flashes, night sweats, vaginal dryness or any vaginal bleeding. Continue monitor and report any vaginal bleeding. 3. Osteopenia. DEXA 2015 T score -2.1. FRAX 12%/2.5%. Plan repeat DEXA next  year a two-year interval. Vitamin D 51 last year. Patient going to have this checked at her primary physician's at her next checkup. 4. Pap smear 2015. No Pap smear done today.  History of slight atypia 1991 with negative colposcopic biopsies. Pap smears normal since then. Options to stop screening altogether versus less frequent screening intervals reviewed. Will readdress on an annual basis. 5. Mammogram 05/2014. Continue with annual mammography. SBE monthly reviewed. 6. Colonoscopy 2015. Repeat at their recommended interval. 7. Health maintenance. No routine blood work done as she is going to have this done at her primary physician's office. Follow up in one year, sooner as needed.   [name], 2:27 PM 09/21/2014

## 2014-09-21 NOTE — Patient Instructions (Signed)
You may obtain a copy of any labs that were done today by logging onto MyChart as outlined in the instructions provided with your AVS (after visit summary). The office will not call with normal lab results but certainly if there are any significant abnormalities then we will contact you.   Health Maintenance, Female A healthy lifestyle and preventative care can promote health and wellness.  Maintain regular health, dental, and eye exams.  Eat a healthy diet. Foods like vegetables, fruits, whole grains, low-fat dairy products, and lean protein foods contain the nutrients you need without too many calories. Decrease your intake of foods high in solid fats, added sugars, and salt. Get information about a proper diet from your caregiver, if necessary.  Regular physical exercise is one of the most important things you can do for your health. Most adults should get at least 150 minutes of moderate-intensity exercise (any activity that increases your heart rate and causes you to sweat) each week. In addition, most adults need muscle-strengthening exercises on 2 or more days a week.   Maintain a healthy weight. The body mass index (BMI) is a screening tool to identify possible weight problems. It provides an estimate of body fat based on height and weight. Your caregiver can help determine your BMI, and can help you achieve or maintain a healthy weight. For adults 20 years and older:  A BMI below 18.5 is considered underweight.  A BMI of 18.5 to 24.9 is normal.  A BMI of 25 to 29.9 is considered overweight.  A BMI of 30 and above is considered obese.  Maintain normal blood lipids and cholesterol by exercising and minimizing your intake of saturated fat. Eat a balanced diet with plenty of fruits and vegetables. Blood tests for lipids and cholesterol should begin at age 61 and be repeated every 5 years. If your lipid or cholesterol levels are high, you are over 50, or you are a high risk for heart  disease, you may need your cholesterol levels checked more frequently.Ongoing high lipid and cholesterol levels should be treated with medicines if diet and exercise are not effective.  If you smoke, find out from your caregiver how to quit. If you do not use tobacco, do not start.  Lung cancer screening is recommended for adults aged 33 80 years who are at high risk for developing lung cancer because of a history of smoking. Yearly low-dose computed tomography (CT) is recommended for people who have at least a 30-pack-year history of smoking and are a current smoker or have quit within the past 15 years. A pack year of smoking is smoking an average of 1 pack of cigarettes a day for 1 year (for example: 1 pack a day for 30 years or 2 packs a day for 15 years). Yearly screening should continue until the smoker has stopped smoking for at least 15 years. Yearly screening should also be stopped for people who develop a health problem that would prevent them from having lung cancer treatment.  If you are pregnant, do not drink alcohol. If you are breastfeeding, be very cautious about drinking alcohol. If you are not pregnant and choose to drink alcohol, do not exceed 1 drink per day. One drink is considered to be 12 ounces (355 mL) of beer, 5 ounces (148 mL) of wine, or 1.5 ounces (44 mL) of liquor.  Avoid use of street drugs. Do not share needles with anyone. Ask for help if you need support or instructions about stopping  the use of drugs.  High blood pressure causes heart disease and increases the risk of stroke. Blood pressure should be checked at least every 1 to 2 years. Ongoing high blood pressure should be treated with medicines, if weight loss and exercise are not effective.  If you are 59 to 71 years old, ask your caregiver if you should take aspirin to prevent strokes.  Diabetes screening involves taking a blood sample to check your fasting blood sugar level. This should be done once every 3  years, after age 91, if you are within normal weight and without risk factors for diabetes. Testing should be considered at a younger age or be carried out more frequently if you are overweight and have at least 1 risk factor for diabetes.  Breast cancer screening is essential preventative care for women. You should practice "breast self-awareness." This means understanding the normal appearance and feel of your breasts and may include breast self-examination. Any changes detected, no matter how small, should be reported to a caregiver. Women in their 66s and 30s should have a clinical breast exam (CBE) by a caregiver as part of a regular health exam every 1 to 3 years. After age 101, women should have a CBE every year. Starting at age 100, women should consider having a mammogram (breast X-ray) every year. Women who have a family history of breast cancer should talk to their caregiver about genetic screening. Women at a high risk of breast cancer should talk to their caregiver about having an MRI and a mammogram every year.  Breast cancer gene (BRCA)-related cancer risk assessment is recommended for women who have family members with BRCA-related cancers. BRCA-related cancers include breast, ovarian, tubal, and peritoneal cancers. Having family members with these cancers may be associated with an increased risk for harmful changes (mutations) in the breast cancer genes BRCA1 and BRCA2. Results of the assessment will determine the need for genetic counseling and BRCA1 and BRCA2 testing.  The Pap test is a screening test for cervical cancer. Women should have a Pap test starting at age 57. Between ages 25 and 35, Pap tests should be repeated every 2 years. Beginning at age 37, you should have a Pap test every 3 years as long as the past 3 Pap tests have been normal. If you had a hysterectomy for a problem that was not cancer or a condition that could lead to cancer, then you no longer need Pap tests. If you are  between ages 50 and 76, and you have had normal Pap tests going back 10 years, you no longer need Pap tests. If you have had past treatment for cervical cancer or a condition that could lead to cancer, you need Pap tests and screening for cancer for at least 20 years after your treatment. If Pap tests have been discontinued, risk factors (such as a new sexual partner) need to be reassessed to determine if screening should be resumed. Some women have medical problems that increase the chance of getting cervical cancer. In these cases, your caregiver may recommend more frequent screening and Pap tests.  The human papillomavirus (HPV) test is an additional test that may be used for cervical cancer screening. The HPV test looks for the virus that can cause the cell changes on the cervix. The cells collected during the Pap test can be tested for HPV. The HPV test could be used to screen women aged 44 years and older, and should be used in women of any age  who have unclear Pap test results. After the age of 30, women should have HPV testing at the same frequency as a Pap test.  Colorectal cancer can be detected and often prevented. Most routine colorectal cancer screening begins at the age of 50 and continues through age 75. However, your caregiver may recommend screening at an earlier age if you have risk factors for colon cancer. On a yearly basis, your caregiver may provide home test kits to check for hidden blood in the stool. Use of a small camera at the end of a tube, to directly examine the colon (sigmoidoscopy or colonoscopy), can detect the earliest forms of colorectal cancer. Talk to your caregiver about this at age 50, when routine screening begins. Direct examination of the colon should be repeated every 5 to 10 years through age 75, unless early forms of pre-cancerous polyps or small growths are found.  Hepatitis C blood testing is recommended for all people born from 1945 through 1965 and any  individual with known risks for hepatitis C.  Practice safe sex. Use condoms and avoid high-risk sexual practices to reduce the spread of sexually transmitted infections (STIs). Sexually active women aged 25 and younger should be checked for Chlamydia, which is a common sexually transmitted infection. Older women with new or multiple partners should also be tested for Chlamydia. Testing for other STIs is recommended if you are sexually active and at increased risk.  Osteoporosis is a disease in which the bones lose minerals and strength with aging. This can result in serious bone fractures. The risk of osteoporosis can be identified using a bone density scan. Women ages 65 and over and women at risk for fractures or osteoporosis should discuss screening with their caregivers. Ask your caregiver whether you should be taking a calcium supplement or vitamin D to reduce the rate of osteoporosis.  Menopause can be associated with physical symptoms and risks. Hormone replacement therapy is available to decrease symptoms and risks. You should talk to your caregiver about whether hormone replacement therapy is right for you.  Use sunscreen. Apply sunscreen liberally and repeatedly throughout the day. You should seek shade when your shadow is shorter than you. Protect yourself by wearing long sleeves, pants, a wide-brimmed hat, and sunglasses year round, whenever you are outdoors.  Notify your caregiver of new moles or changes in moles, especially if there is a change in shape or color. Also notify your caregiver if a mole is larger than the size of a pencil eraser.  Stay current with your immunizations. Document Released: 10/01/2010 Document Revised: 07/13/2012 Document Reviewed: 10/01/2010 [employer] Patient Information 2014 [employer].   

## 2014-09-22 LAB — URINALYSIS W MICROSCOPIC + REFLEX CULTURE
Bacteria, UA: NONE SEEN
Bilirubin Urine: NEGATIVE
CRYSTALS: NONE SEEN
Casts: NONE SEEN
Glucose, UA: NEGATIVE mg/dL
Hgb urine dipstick: NEGATIVE
Ketones, ur: NEGATIVE mg/dL
Leukocytes, UA: NEGATIVE
NITRITE: NEGATIVE
Protein, ur: NEGATIVE mg/dL
SPECIFIC GRAVITY, URINE: 1.006 (ref 1.005–1.030)
Squamous Epithelial / LPF: NONE SEEN
Urobilinogen, UA: 0.2 mg/dL (ref 0.0–1.0)
pH: 6 (ref 5.0–8.0)

## 2014-09-26 ENCOUNTER — Other Ambulatory Visit: Payer: Self-pay

## 2014-10-13 ENCOUNTER — Encounter: Payer: Self-pay | Admitting: Emergency Medicine

## 2014-10-13 ENCOUNTER — Ambulatory Visit (INDEPENDENT_AMBULATORY_CARE_PROVIDER_SITE_OTHER): Payer: Medicare Other | Admitting: Emergency Medicine

## 2014-10-13 VITALS — BP 132/86 | HR 79 | Temp 97.8°F | Resp 16 | Ht 68.0 in | Wt 165.8 lb

## 2014-10-13 DIAGNOSIS — Z0000 Encounter for general adult medical examination without abnormal findings: Secondary | ICD-10-CM

## 2014-10-13 DIAGNOSIS — Z23 Encounter for immunization: Secondary | ICD-10-CM | POA: Diagnosis not present

## 2014-10-13 DIAGNOSIS — M8580 Other specified disorders of bone density and structure, unspecified site: Secondary | ICD-10-CM

## 2014-10-13 DIAGNOSIS — M899 Disorder of bone, unspecified: Secondary | ICD-10-CM | POA: Diagnosis not present

## 2014-10-13 DIAGNOSIS — R319 Hematuria, unspecified: Secondary | ICD-10-CM

## 2014-10-13 DIAGNOSIS — E785 Hyperlipidemia, unspecified: Secondary | ICD-10-CM

## 2014-10-13 LAB — CBC WITH DIFFERENTIAL/PLATELET
BASOS ABS: 0.1 10*3/uL (ref 0.0–0.1)
Basophils Relative: 2 % — ABNORMAL HIGH (ref 0–1)
Eosinophils Absolute: 0.1 10*3/uL (ref 0.0–0.7)
Eosinophils Relative: 3 % (ref 0–5)
HCT: 41.9 % (ref 36.0–46.0)
HEMOGLOBIN: 14.1 g/dL (ref 12.0–15.0)
Lymphocytes Relative: 41 % (ref 12–46)
Lymphs Abs: 1.7 10*3/uL (ref 0.7–4.0)
MCH: 28.1 pg (ref 26.0–34.0)
MCHC: 33.7 g/dL (ref 30.0–36.0)
MCV: 83.5 fL (ref 78.0–100.0)
MPV: 9.1 fL (ref 8.6–12.4)
Monocytes Absolute: 0.3 10*3/uL (ref 0.1–1.0)
Monocytes Relative: 7 % (ref 3–12)
NEUTROS PCT: 47 % (ref 43–77)
Neutro Abs: 2 10*3/uL (ref 1.7–7.7)
PLATELETS: 234 10*3/uL (ref 150–400)
RBC: 5.02 MIL/uL (ref 3.87–5.11)
RDW: 14.3 % (ref 11.5–15.5)
WBC: 4.2 10*3/uL (ref 4.0–10.5)

## 2014-10-13 LAB — POCT URINALYSIS DIPSTICK
BILIRUBIN UA: NEGATIVE
Glucose, UA: NEGATIVE
Ketones, UA: NEGATIVE
Leukocytes, UA: NEGATIVE
Nitrite, UA: NEGATIVE
PH UA: 5.5
PROTEIN UA: NEGATIVE
Spec Grav, UA: 1.02
UROBILINOGEN UA: 0.2

## 2014-10-13 LAB — COMPLETE METABOLIC PANEL WITH GFR
ALBUMIN: 4.4 g/dL (ref 3.5–5.2)
ALT: 17 U/L (ref 0–35)
AST: 17 U/L (ref 0–37)
Alkaline Phosphatase: 81 U/L (ref 39–117)
BUN: 21 mg/dL (ref 6–23)
CHLORIDE: 105 meq/L (ref 96–112)
CO2: 22 mEq/L (ref 19–32)
Calcium: 9.6 mg/dL (ref 8.4–10.5)
Creat: 0.86 mg/dL (ref 0.50–1.10)
GFR, EST NON AFRICAN AMERICAN: 68 mL/min
GFR, Est African American: 79 mL/min
GLUCOSE: 97 mg/dL (ref 70–99)
Potassium: 4.3 mEq/L (ref 3.5–5.3)
SODIUM: 141 meq/L (ref 135–145)
Total Bilirubin: 0.6 mg/dL (ref 0.2–1.2)
Total Protein: 7.2 g/dL (ref 6.0–8.3)

## 2014-10-13 LAB — POCT UA - MICROSCOPIC ONLY
CRYSTALS, UR, HPF, POC: NEGATIVE
Casts, Ur, LPF, POC: NEGATIVE
Epithelial cells, urine per micros: NEGATIVE
MUCUS UA: POSITIVE
Yeast, UA: NEGATIVE

## 2014-10-13 LAB — LIPID PANEL
Cholesterol: 221 mg/dL — ABNORMAL HIGH (ref 0–200)
HDL: 74 mg/dL (ref >=46)
LDL Cholesterol: 127 mg/dL — ABNORMAL HIGH (ref 0–99)
TRIGLYCERIDES: 101 mg/dL (ref <150)
Total CHOL/HDL Ratio: 3 Ratio
VLDL: 20 mg/dL (ref 0–40)

## 2014-10-13 LAB — TSH: TSH: 3.223 u[IU]/mL (ref 0.350–4.500)

## 2014-10-13 NOTE — Progress Notes (Deleted)
   Subjective:    Patient ID: Debra Frederick, female    DOB: June 27, 1945, 69 y.o.   MRN: [id number]  HPI    Review of Systems  Constitutional: Negative.   HENT: Positive for ear pain.   Eyes: Positive for visual disturbance.  Respiratory: Negative.   Cardiovascular: Negative.   Gastrointestinal: Negative.   Endocrine: Negative.   Genitourinary: Negative.   Musculoskeletal: Negative.   Skin: Negative.   Allergic/Immunologic: Negative.   Neurological: Negative.   Hematological: Negative.   Psychiatric/Behavioral: Negative.        Objective:   Physical Exam        Assessment & Plan:

## 2014-10-13 NOTE — Patient Instructions (Addendum)
Health Maintenance Adopting a healthy lifestyle and getting preventive care can go a long way to promote health and wellness. Talk with your health care provider about what schedule of regular examinations is right for you. This is a good chance for you to check in with your provider about disease prevention and staying healthy. In between checkups, there are plenty of things you can do on your own. Experts have done a lot of research about which lifestyle changes and preventive measures are most likely to keep you healthy. Ask your health care provider for more information. WEIGHT AND DIET  Eat a healthy diet  Be sure to include plenty of vegetables, fruits, low-fat dairy products, and lean protein.  Do not eat a lot of foods high in solid fats, added sugars, or salt.  Get regular exercise. This is one of the most important things you can do for your health.  Most adults should exercise for at least 150 minutes each week. The exercise should increase your heart rate and make you sweat (moderate-intensity exercise).  Most adults should also do strengthening exercises at least twice a week. This is in addition to the moderate-intensity exercise.  Maintain a healthy weight  Body mass index (BMI) is a measurement that can be used to identify possible weight problems. It estimates body fat based on height and weight. Your health care provider can help determine your BMI and help you achieve or maintain a healthy weight.  For females 25 years of age and older:   A BMI below 18.5 is considered underweight.  A BMI of 18.5 to 24.9 is normal.  A BMI of 25 to 29.9 is considered overweight.  A BMI of 30 and above is considered obese.  Watch levels of cholesterol and blood lipids  You should start having your blood tested for lipids and cholesterol at 71 years of age, then have this test every 5 years.  You may need to have your cholesterol levels checked more often if:  Your lipid or  cholesterol levels are high.  You are older than 71 years of age.  You are at high risk for heart disease.  CANCER SCREENING   Lung Cancer  Lung cancer screening is recommended for adults 97-92 years old who are at high risk for lung cancer because of a history of smoking.  A yearly low-dose CT scan of the lungs is recommended for people who:  Currently smoke.  Have quit within the past 15 years.  Have at least a 30-pack-year history of smoking. A pack year is smoking an average of one pack of cigarettes a day for 1 year.  Yearly screening should continue until it has been 15 years since you quit.  Yearly screening should stop if you develop a health problem that would prevent you from having lung cancer treatment.  Breast Cancer  Practice breast self-awareness. This means understanding how your breasts normally appear and feel.  It also means doing regular breast self-exams. Let your health care provider know about any changes, no matter how small.  If you are in your 20s or 30s, you should have a clinical breast exam (CBE) by a health care provider every 1-3 years as part of a regular health exam.  If you are 76 or older, have a CBE every year. Also consider having a breast X-ray (mammogram) every year.  If you have a family history of breast cancer, talk to your health care provider about genetic screening.  If you are  at high risk for breast cancer, talk to your health care provider about having an MRI and a mammogram every year.  Breast cancer gene (BRCA) assessment is recommended for women who have family members with BRCA-related cancers. BRCA-related cancers include:  Breast.  Ovarian.  Tubal.  Peritoneal cancers.  Results of the assessment will determine the need for genetic counseling and BRCA1 and BRCA2 testing. Cervical Cancer Routine pelvic examinations to screen for cervical cancer are no longer recommended for nonpregnant women who are considered low  risk for cancer of the pelvic organs (ovaries, uterus, and vagina) and who do not have symptoms. A pelvic examination may be necessary if you have symptoms including those associated with pelvic infections. Ask your health care provider if a screening pelvic exam is right for you.   The Pap test is the screening test for cervical cancer for women who are considered at risk.  If you had a hysterectomy for a problem that was not cancer or a condition that could lead to cancer, then you no longer need Pap tests.  If you are older than 65 years, and you have had normal Pap tests for the past 10 years, you no longer need to have Pap tests.  If you have had past treatment for cervical cancer or a condition that could lead to cancer, you need Pap tests and screening for cancer for at least 20 years after your treatment.  If you no longer get a Pap test, assess your risk factors if they change (such as having a new sexual partner). This can affect whether you should start being screened again.  Some women have medical problems that increase their chance of getting cervical cancer. If this is the case for you, your health care provider may recommend more frequent screening and Pap tests.  The human papillomavirus (HPV) test is another test that may be used for cervical cancer screening. The HPV test looks for the virus that can cause cell changes in the cervix. The cells collected during the Pap test can be tested for HPV.  The HPV test can be used to screen women 30 years of age and older. Getting tested for HPV can extend the interval between normal Pap tests from three to five years.  An HPV test also should be used to screen women of any age who have unclear Pap test results.  After 71 years of age, women should have HPV testing as often as Pap tests.  Colorectal Cancer  This type of cancer can be detected and often prevented.  Routine colorectal cancer screening usually begins at 71 years of  age and continues through 71 years of age.  Your health care provider may recommend screening at an earlier age if you have risk factors for colon cancer.  Your health care provider may also recommend using home test kits to check for hidden blood in the stool.  A small camera at the end of a tube can be used to examine your colon directly (sigmoidoscopy or colonoscopy). This is done to check for the earliest forms of colorectal cancer.  Routine screening usually begins at age 50.  Direct examination of the colon should be repeated every 5-10 years through 71 years of age. However, you may need to be screened more often if early forms of precancerous polyps or small growths are found. Skin Cancer  Check your skin from head to toe regularly.  Tell your health care provider about any new moles or changes in   moles, especially if there is a change in a mole's shape or color.  Also tell your health care provider if you have a mole that is larger than the size of a pencil eraser.  Always use sunscreen. Apply sunscreen liberally and repeatedly throughout the day.  Protect yourself by wearing long sleeves, pants, a wide-brimmed hat, and sunglasses whenever you are outside. HEART DISEASE, DIABETES, AND HIGH BLOOD PRESSURE   Have your blood pressure checked at least every 1-2 years. High blood pressure causes heart disease and increases the risk of stroke.  If you are between 75 years and 42 years old, ask your health care provider if you should take aspirin to prevent strokes.  Have regular diabetes screenings. This involves taking a blood sample to check your fasting blood sugar level.  If you are at a normal weight and have a low risk for diabetes, have this test once every three years after 71 years of age.  If you are overweight and have a high risk for diabetes, consider being tested at a younger age or more often. PREVENTING INFECTION  Hepatitis B  If you have a higher risk for  hepatitis B, you should be screened for this virus. You are considered at high risk for hepatitis B if:  You were born in a country where hepatitis B is common. Ask your health care provider which countries are considered high risk.  Your parents were born in a high-risk country, and you have not been immunized against hepatitis B (hepatitis B vaccine).  You have HIV or AIDS.  You use needles to inject street drugs.  You live with someone who has hepatitis B.  You have had sex with someone who has hepatitis B.  You get hemodialysis treatment.  You take certain medicines for conditions, including cancer, organ transplantation, and autoimmune conditions. Hepatitis C  Blood testing is recommended for:  Everyone born from [birth year] through 1967.  Anyone with known risk factors for hepatitis C. Sexually transmitted infections (STIs)  You should be screened for sexually transmitted infections (STIs) including gonorrhea and chlamydia if:  You are sexually active and are younger than 71 years of age.  You are older than 71 years of age and your health care provider tells you that you are at risk for this type of infection.  Your sexual activity has changed since you were last screened and you are at an increased risk for chlamydia or gonorrhea. Ask your health care provider if you are at risk.  If you do not have HIV, but are at risk, it may be recommended that you take a prescription medicine daily to prevent HIV infection. This is called pre-exposure prophylaxis (PrEP). You are considered at risk if:  You are sexually active and do not regularly use condoms or know the HIV status of your partner(s).  You take drugs by injection.  You are sexually active with a partner who has HIV. Talk with your health care provider about whether you are at high risk of being infected with HIV. If you choose to begin PrEP, you should first be tested for HIV. You should then be tested every 3 months for  as long as you are taking PrEP.  PREGNANCY   If you are premenopausal and you may become pregnant, ask your health care provider about preconception counseling.  If you may become pregnant, take 400 to 800 micrograms (mcg) of folic acid every day.  If you want to prevent pregnancy, talk to your  health care provider about birth control (contraception). OSTEOPOROSIS AND MENOPAUSE   Osteoporosis is a disease in which the bones lose minerals and strength with aging. This can result in serious bone fractures. Your risk for osteoporosis can be identified using a bone density scan.  If you are 55 years of age or older, or if you are at risk for osteoporosis and fractures, ask your health care provider if you should be screened.  Ask your health care provider whether you should take a calcium or vitamin D supplement to lower your risk for osteoporosis.  Menopause may have certain physical symptoms and risks.  Hormone replacement therapy may reduce some of these symptoms and risks. Talk to your health care provider about whether hormone replacement therapy is right for you.  HOME CARE INSTRUCTIONS   Schedule regular health, dental, and eye exams.  Stay current with your immunizations.   Do not use any tobacco products including cigarettes, chewing tobacco, or electronic cigarettes.  If you are pregnant, do not drink alcohol.  If you are breastfeeding, limit how much and how often you drink alcohol.  Limit alcohol intake to no more than 1 drink per day for nonpregnant women. One drink equals 12 ounces of beer, 5 ounces of wine, or 1 ounces of hard liquor.  Do not use street drugs.  Do not share needles.  Ask your health care provider for help if you need support or information about quitting drugs.  Tell your health care provider if you often feel depressed.  Tell your health care provider if you have ever been abused or do not feel safe at home. Document Released: 10/01/2010  Document Revised: 08/02/2013 Document Reviewed: 02/17/2013 [facility] Patient Information 2015 [employer], [place]. This information is not intended to replace advice given to you by your health care provider. Make sure you discuss any questions you have with your health care provider. Hematuria Hematuria is blood in your urine. It can be caused by a bladder infection, kidney infection, prostate infection, kidney stone, or cancer of your urinary tract. Infections can usually be treated with medicine, and a kidney stone usually will pass through your urine. If neither of these is the cause of your hematuria, further workup to find out the reason may be needed. It is very important that you tell your health care provider about any blood you see in your urine, even if the blood stops without treatment or happens without causing pain. Blood in your urine that happens and then stops and then happens again can be a symptom of a very serious condition. Also, pain is not a symptom in the initial stages of many urinary cancers. HOME CARE INSTRUCTIONS   Drink lots of fluid, 3-4 quarts a day. If you have been diagnosed with an infection, cranberry juice is especially recommended, in addition to large amounts of water.  Avoid caffeine, tea, and carbonated beverages because they tend to irritate the bladder.  Avoid alcohol because it may irritate the prostate.  Take all medicines as directed by your health care provider.  If you were prescribed an antibiotic medicine, finish it all even if you start to feel better.  If you have been diagnosed with a kidney stone, follow your health care provider's instructions regarding straining your urine to catch the stone.  Empty your bladder often. Avoid holding urine for long periods of time.  After a bowel movement, women should cleanse front to back. Use each tissue only once.  Empty your bladder before and  after sexual intercourse if you are a female. SEEK MEDICAL CARE  IF:  You develop back pain.  You have a fever.  You have a feeling of sickness in your stomach (nausea) or vomiting.  Your symptoms are not better in 3 days. Return sooner if you are getting worse. SEEK IMMEDIATE MEDICAL CARE IF:   You develop severe vomiting and are unable to keep the medicine down.  You develop severe back or abdominal pain despite taking your medicines.  You begin passing a large amount of blood or clots in your urine.  You feel extremely weak or faint, or you pass out. MAKE SURE YOU:   Understand these instructions.  Will watch your condition.  Will get help right away if you are not doing well or get worse. Document Released: 03/18/2005 Document Revised: 08/02/2013 Document Reviewed: 11/16/2012 [facility] Patient Information 2015 [place], [place]. This information is not intended to replace advice given to you by your health care provider. Make sure you discuss any questions you have with your health care provider.

## 2014-10-13 NOTE — Progress Notes (Signed)
   Subjective:    Patient ID: Debra Frederick, female    DOB: 11/27/1945, 69 y.o.   MRN: [id number] This chart was scribed for Debra Queen, MD by Zola Button, Medical Scribe. This patient was seen in room 22 and the patient's care was started at 8:37 AM.   HPI HPI Comments: SIHAAM CHROBAK is a 69 y.o. female who presents to the [facility] for a complete physical exam. Patient had cataracts surgery in her left eye 3 months ago at [facility]. She will have cataracts surgery in her right eye. Patient exercises at a gym and does yoga. She is still working.  Patient has had intermittent aching right ear pain since this past winter. She feels the pain with water exposure.  Dermatologist: Dr. Danella Sensing  Review of Systems 13 point ROS reviewed on patient health survey. Negative other than listed above or in nursing note. See nursing note.     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 Small area of wax, right ear. NECK: supple no meningeal signs SPINE: entire spine nontender CV: S1/S2 noted, no murmurs/rubs/gallops noted. Repeat blood pressure: 122/84. LUNGS: Lungs are clear to auscultation bilaterally, no apparent distress ABDOMEN: soft, nontender, no rebound or guarding GU: no cva tenderness NEURO: Pt is awake/alert, moves all extremitiesx4 EXTREMITIES: pulses normal, full ROM SKIN: warm, color normal PSYCH: no abnormalities of mood noted Results for orders placed or performed in visit on 10/13/14  POCT UA - Microscopic Only  Result Value Ref Range   WBC, Ur, HPF, POC 0-1    RBC, urine, microscopic 5-7    Bacteria, U Microscopic 1+    Mucus, UA pos    Epithelial cells, urine per micros neg    Crystals, Ur, HPF, POC neg    Casts, Ur, LPF, POC neg    Yeast, UA neg   POCT urinalysis dipstick  Result Value Ref Range   Color, UA yellow    Clarity, UA clear    Glucose, UA neg    Bilirubin, UA neg    Ketones, UA neg    Spec Grav, UA 1.020    Blood, UA trace    pH, UA 5.5    Protein, UA neg    Urobilinogen, UA 0.2    Nitrite, UA neg    Leukocytes, UA Negative Negative       Assessment & Plan:    1. Annual physical exam Patient has a normal physical exam. She is very healthy exercises regular does not smoke maintains a healthy lifestyle - POCT UA - Microscopic Only - POCT urinalysis dipstick  2. Hyperlipidemia  - TSH - Lipid panel  3. Osteopenia  - CBC with Differential/Platelet - COMPLETE METABOLIC PANEL WITH GFR - TSH - Vit D  25 hydroxy (rtn osteoporosis monitoring)   I personally performed the services described in this documentation, which was scribed in my presence. The recorded information has been reviewed and is accurate.  Debra Queen, MD  [facility]  10/13/2014 9:01 AM

## 2014-10-14 ENCOUNTER — Encounter: Payer: Self-pay | Admitting: Family Medicine

## 2014-10-14 LAB — URINE CULTURE: Colony Count: 50000

## 2014-10-14 LAB — VITAMIN D 25 HYDROXY (VIT D DEFICIENCY, FRACTURES): Vit D, 25-Hydroxy: 44 ng/mL (ref 30–100)

## 2014-10-18 ENCOUNTER — Ambulatory Visit
Admission: RE | Admit: 2014-10-18 | Discharge: 2014-10-18 | Disposition: A | Discharge status: Home / Self Care | Payer: Self-pay | Source: Ambulatory Visit | Attending: Emergency Medicine | Admitting: Emergency Medicine

## 2014-10-18 DIAGNOSIS — R319 Hematuria, unspecified: Secondary | ICD-10-CM

## 2014-10-18 DIAGNOSIS — N281 Cyst of kidney, acquired: Secondary | ICD-10-CM | POA: Diagnosis not present

## 2014-10-18 DIAGNOSIS — I728 Aneurysm of other specified arteries: Secondary | ICD-10-CM | POA: Diagnosis not present

## 2014-10-21 ENCOUNTER — Telehealth: Payer: Self-pay

## 2014-10-25 DIAGNOSIS — D2362 Other benign neoplasm of skin of left upper limb, including shoulder: Secondary | ICD-10-CM | POA: Diagnosis not present

## 2014-10-25 DIAGNOSIS — C44712 Basal cell carcinoma of skin of right lower limb, including hip: Secondary | ICD-10-CM | POA: Diagnosis not present

## 2014-10-25 DIAGNOSIS — Z85828 Personal history of other malignant neoplasm of skin: Secondary | ICD-10-CM | POA: Diagnosis not present

## 2014-10-25 DIAGNOSIS — L821 Other seborrheic keratosis: Secondary | ICD-10-CM | POA: Diagnosis not present

## 2014-12-06 NOTE — Telephone Encounter (Signed)
Error

## 2014-12-12 DIAGNOSIS — H26492 Other secondary cataract, left eye: Secondary | ICD-10-CM | POA: Diagnosis not present

## 2014-12-12 DIAGNOSIS — H25811 Combined forms of age-related cataract, right eye: Secondary | ICD-10-CM | POA: Diagnosis not present

## 2014-12-12 DIAGNOSIS — H35341 Macular cyst, hole, or pseudohole, right eye: Secondary | ICD-10-CM | POA: Diagnosis not present

## 2014-12-12 DIAGNOSIS — Z961 Presence of intraocular lens: Secondary | ICD-10-CM | POA: Diagnosis not present

## 2014-12-20 DIAGNOSIS — H35341 Macular cyst, hole, or pseudohole, right eye: Secondary | ICD-10-CM | POA: Diagnosis not present

## 2015-01-03 ENCOUNTER — Encounter: Payer: Self-pay | Admitting: Emergency Medicine

## 2015-01-24 DIAGNOSIS — H35341 Macular cyst, hole, or pseudohole, right eye: Secondary | ICD-10-CM | POA: Diagnosis not present

## 2015-02-28 DIAGNOSIS — H35341 Macular cyst, hole, or pseudohole, right eye: Secondary | ICD-10-CM | POA: Diagnosis not present

## 2015-04-04 DIAGNOSIS — H35341 Macular cyst, hole, or pseudohole, right eye: Secondary | ICD-10-CM | POA: Diagnosis not present

## 2015-04-24 DIAGNOSIS — H26492 Other secondary cataract, left eye: Secondary | ICD-10-CM | POA: Diagnosis not present

## 2015-04-24 DIAGNOSIS — H25811 Combined forms of age-related cataract, right eye: Secondary | ICD-10-CM | POA: Diagnosis not present

## 2015-05-04 DIAGNOSIS — H26492 Other secondary cataract, left eye: Secondary | ICD-10-CM | POA: Diagnosis not present

## 2015-06-08 ENCOUNTER — Ambulatory Visit: Payer: Medicare Other | Admitting: Emergency Medicine

## 2015-06-15 DIAGNOSIS — H35341 Macular cyst, hole, or pseudohole, right eye: Secondary | ICD-10-CM | POA: Diagnosis not present

## 2015-06-22 DIAGNOSIS — H25811 Combined forms of age-related cataract, right eye: Secondary | ICD-10-CM | POA: Diagnosis not present

## 2015-06-22 DIAGNOSIS — H2511 Age-related nuclear cataract, right eye: Secondary | ICD-10-CM | POA: Diagnosis not present

## 2015-07-01 DIAGNOSIS — M8580 Other specified disorders of bone density and structure, unspecified site: Secondary | ICD-10-CM

## 2015-07-01 HISTORY — DX: Other specified disorders of bone density and structure, unspecified site: M85.80

## 2015-07-19 DIAGNOSIS — Z1231 Encounter for screening mammogram for malignant neoplasm of breast: Secondary | ICD-10-CM | POA: Diagnosis not present

## 2015-07-19 DIAGNOSIS — Z803 Family history of malignant neoplasm of breast: Secondary | ICD-10-CM | POA: Diagnosis not present

## 2015-07-19 DIAGNOSIS — M8589 Other specified disorders of bone density and structure, multiple sites: Secondary | ICD-10-CM | POA: Diagnosis not present

## 2015-07-21 ENCOUNTER — Telehealth: Payer: Self-pay | Admitting: Gynecology

## 2015-07-21 ENCOUNTER — Encounter: Payer: Self-pay | Admitting: Gynecology

## 2015-07-21 NOTE — Telephone Encounter (Signed)
Tell patient recent bone density shows slightly higher risk of fracture. Question is whether to start medication or not. Will discuss with her when I see her in June when she is due for her annual exam.

## 2015-07-21 NOTE — Telephone Encounter (Signed)
Left message for pt to call.

## 2015-07-21 NOTE — Telephone Encounter (Signed)
Pt informed with the below. 

## 2015-09-14 DIAGNOSIS — H35341 Macular cyst, hole, or pseudohole, right eye: Secondary | ICD-10-CM | POA: Diagnosis not present

## 2015-09-22 ENCOUNTER — Ambulatory Visit (INDEPENDENT_AMBULATORY_CARE_PROVIDER_SITE_OTHER): Payer: Medicare Other | Admitting: Gynecology

## 2015-09-22 ENCOUNTER — Encounter: Payer: Self-pay | Admitting: Gynecology

## 2015-09-22 VITALS — BP 122/80 | Ht 68.0 in | Wt 168.0 lb

## 2015-09-22 DIAGNOSIS — N816 Rectocele: Secondary | ICD-10-CM

## 2015-09-22 DIAGNOSIS — Z01419 Encounter for gynecological examination (general) (routine) without abnormal findings: Secondary | ICD-10-CM | POA: Diagnosis not present

## 2015-09-22 DIAGNOSIS — M8580 Other specified disorders of bone density and structure, unspecified site: Secondary | ICD-10-CM | POA: Diagnosis not present

## 2015-09-22 DIAGNOSIS — N952 Postmenopausal atrophic vaginitis: Secondary | ICD-10-CM

## 2015-09-22 NOTE — Progress Notes (Signed)
    Debra Frederick 08-14-1945 [id number]        70 y.o.  [id number]  for breast and pelvic exam. Several issues noted below.  Past medical history,surgical history, problem list, medications, allergies, family history and social history were all reviewed and documented as reviewed in the EPIC chart.  ROS:  Performed with pertinent positives and negatives included in the history, assessment and plan.   Additional significant findings :  None   Exam: [name] assistant Filed Vitals:   09/22/15 1400  BP: 122/80  Height: 5\' 8"  (1.727 m)  Weight: 168 lb (76.204 kg)   General appearance:  Normal affect, orientation and appearance. Skin: Grossly normal HEENT: Without gross lesions.  No cervical or supraclavicular adenopathy. Thyroid normal.  Lungs:  Clear without wheezing, rales or rhonchi Cardiac: RR, without RMG Abdominal:  Soft, nontender, without masses, guarding, rebound, organomegaly or hernia Breasts:  Examined lying and sitting without masses, retractions, discharge or axillary adenopathy. Pelvic:  Ext/BUS/vagina with atrophic changes. Mild rectocele noted.  Cervix atrophic  Uterus anteverted, normal size, shape and contour, midline and mobile nontender   Adnexa without masses or tenderness    Anus and perineum normal   Rectovaginal normal sphincter tone without palpated masses or tenderness.    Assessment/Plan:  70 y.o. [id number] female for breast and pelvic exam.   1. Osteopenia.  DEXA 07/2015 T score -2.3 right femoral FRAX 13%/2.8%, left femoral FRAX 14%/3.3%. I reviewed in detail with her her bone density report to include comparison to prior years which showed a 4% decline at one site I. I discussed T-scores and Z scores as well as the FRAX. It was calculated both for the right and left femurs and the left femur had an increased hip fracture 10 year risk at 3.3% exceeding the 3% limit to discuss treatment. I reviewed options for treatment to include the bisphosphonates.  Medication side effects and risks discussed to include GERD, osteonecrosis of the jaw and atypical fractures. After a lengthy discussion the patient does not want to be treated at this time but elects for expectant management noting that she is active and does exercise on a regular basis. She is going to assess her calcium intake and have Dr. Everlene Farrier check her vitamin D level in several weeks when she has an appointment to see him. 2. Mild rectocele. Asymptomatic to the patient. Stable from prior exams. Plan expectant management with yearly exams. 3. Mammography 07/2015. Continue with annual mammography when due. SBE monthly reviewed. 4. Colonoscopy 2015. Repeat at their recommended interval. 5. Pap smear 2015. No Pap smear done today. No history of significant abnormal Pap smears noting slight atypia 1991 with negative colposcopy and biopsies. Normal Pap smears since then. Plan repeat Pap smear at 3 year interval. Options to stop screening was also reviewed per current screening guidelines. 6. Health maintenance. No routine lab work done as this is done at her primary physician's office. Follow up 1 year, sooner as needed.  Greater than 15 minutes of my time in excess of her breast and pelvic exam was spent in direct face to face counseling and coordination of care in regards to her problems of osteopenia and increased FRAX calculation.   [name], 2:24 PM 09/22/2015

## 2015-09-22 NOTE — Patient Instructions (Addendum)
Make sure you have your vitamin D checked when you see Dr. Osvaldo Human may obtain a copy of any labs that were done today by logging onto MyChart as outlined in the instructions provided with your AVS (after visit summary). The office will not call with normal lab results but certainly if there are any significant abnormalities then we will contact you.   Health Maintenance Adopting a healthy lifestyle and getting preventive care can go a long way to promote health and wellness. Talk with your health care provider about what schedule of regular examinations is right for you. This is a good chance for you to check in with your provider about disease prevention and staying healthy. In between checkups, there are plenty of things you can do on your own. Experts have done a lot of research about which lifestyle changes and preventive measures are most likely to keep you healthy. Ask your health care provider for more information. WEIGHT AND DIET  Eat a healthy diet  Be sure to include plenty of vegetables, fruits, low-fat dairy products, and lean protein.  Do not eat a lot of foods high in solid fats, added sugars, or salt.  Get regular exercise. This is one of the most important things you can do for your health.  Most adults should exercise for at least 150 minutes each week. The exercise should increase your heart rate and make you sweat (moderate-intensity exercise).  Most adults should also do strengthening exercises at least twice a week. This is in addition to the moderate-intensity exercise.  Maintain a healthy weight  Body mass index (BMI) is a measurement that can be used to identify possible weight problems. It estimates body fat based on height and weight. Your health care provider can help determine your BMI and help you achieve or maintain a healthy weight.  For females 31 years of age and older:   A BMI below 18.5 is considered underweight.  A BMI of 18.5 to 24.9 is normal.  A  BMI of 25 to 29.9 is considered overweight.  A BMI of 30 and above is considered obese.  Watch levels of cholesterol and blood lipids  You should start having your blood tested for lipids and cholesterol at 72 years of age, then have this test every 5 years.  You may need to have your cholesterol levels checked more often if:  Your lipid or cholesterol levels are high.  You are older than 73 years of age.  You are at high risk for heart disease.  CANCER SCREENING   Lung Cancer  Lung cancer screening is recommended for adults 15-30 years old who are at high risk for lung cancer because of a history of smoking.  A yearly low-dose CT scan of the lungs is recommended for people who:  Currently smoke.  Have quit within the past 15 years.  Have at least a 30-pack-year history of smoking. A pack year is smoking an average of one pack of cigarettes a day for 1 year.  Yearly screening should continue until it has been 15 years since you quit.  Yearly screening should stop if you develop a health problem that would prevent you from having lung cancer treatment.  Breast Cancer  Practice breast self-awareness. This means understanding how your breasts normally appear and feel.  It also means doing regular breast self-exams. Let your health care provider know about any changes, no matter how small.  If you are in your 20s or 30s, you  should have a clinical breast exam (CBE) by a health care provider every 1-3 years as part of a regular health exam.  If you are 86 or older, have a CBE every year. Also consider having a breast X-ray (mammogram) every year.  If you have a family history of breast cancer, talk to your health care provider about genetic screening.  If you are at high risk for breast cancer, talk to your health care provider about having an MRI and a mammogram every year.  Breast cancer gene (BRCA) assessment is recommended for women who have family members with  BRCA-related cancers. BRCA-related cancers include:  Breast.  Ovarian.  Tubal.  Peritoneal cancers.  Results of the assessment will determine the need for genetic counseling and BRCA1 and BRCA2 testing. Cervical Cancer Routine pelvic examinations to screen for cervical cancer are no longer recommended for nonpregnant women who are considered low risk for cancer of the pelvic organs (ovaries, uterus, and vagina) and who do not have symptoms. A pelvic examination may be necessary if you have symptoms including those associated with pelvic infections. Ask your health care provider if a screening pelvic exam is right for you.   The Pap test is the screening test for cervical cancer for women who are considered at risk.  If you had a hysterectomy for a problem that was not cancer or a condition that could lead to cancer, then you no longer need Pap tests.  If you are older than 65 years, and you have had normal Pap tests for the past 10 years, you no longer need to have Pap tests.  If you have had past treatment for cervical cancer or a condition that could lead to cancer, you need Pap tests and screening for cancer for at least 20 years after your treatment.  If you no longer get a Pap test, assess your risk factors if they change (such as having a new sexual partner). This can affect whether you should start being screened again.  Some women have medical problems that increase their chance of getting cervical cancer. If this is the case for you, your health care provider may recommend more frequent screening and Pap tests.  The human papillomavirus (HPV) test is another test that may be used for cervical cancer screening. The HPV test looks for the virus that can cause cell changes in the cervix. The cells collected during the Pap test can be tested for HPV.  The HPV test can be used to screen women 67 years of age and older. Getting tested for HPV can extend the interval between normal Pap  tests from three to five years.  An HPV test also should be used to screen women of any age who have unclear Pap test results.  After 72 years of age, women should have HPV testing as often as Pap tests.  Colorectal Cancer  This type of cancer can be detected and often prevented.  Routine colorectal cancer screening usually begins at 72 years of age and continues through 71 years of age.  Your health care provider may recommend screening at an earlier age if you have risk factors for colon cancer.  Your health care provider may also recommend using home test kits to check for hidden blood in the stool.  A small camera at the end of a tube can be used to examine your colon directly (sigmoidoscopy or colonoscopy). This is done to check for the earliest forms of colorectal cancer.  Routine screening  usually begins at age 81.  Direct examination of the colon should be repeated every 5-10 years through 72 years of age. However, you may need to be screened more often if early forms of precancerous polyps or small growths are found. Skin Cancer  Check your skin from head to toe regularly.  Tell your health care provider about any new moles or changes in moles, especially if there is a change in a mole's shape or color.  Also tell your health care provider if you have a mole that is larger than the size of a pencil eraser.  Always use sunscreen. Apply sunscreen liberally and repeatedly throughout the day.  Protect yourself by wearing long sleeves, pants, a wide-brimmed hat, and sunglasses whenever you are outside. HEART DISEASE, DIABETES, AND HIGH BLOOD PRESSURE   Have your blood pressure checked at least every 1-2 years. High blood pressure causes heart disease and increases the risk of stroke.  If you are between 77 years and 81 years old, ask your health care provider if you should take aspirin to prevent strokes.  Have regular diabetes screenings. This involves taking a blood sample  to check your fasting blood sugar level.  If you are at a normal weight and have a low risk for diabetes, have this test once every three years after 72 years of age.  If you are overweight and have a high risk for diabetes, consider being tested at a younger age or more often. PREVENTING INFECTION  Hepatitis B  If you have a higher risk for hepatitis B, you should be screened for this virus. You are considered at high risk for hepatitis B if:  You were born in a country where hepatitis B is common. Ask your health care provider which countries are considered high risk.  Your parents were born in a high-risk country, and you have not been immunized against hepatitis B (hepatitis B vaccine).  You have HIV or AIDS.  You use needles to inject street drugs.  You live with someone who has hepatitis B.  You have had sex with someone who has hepatitis B.  You get hemodialysis treatment.  You take certain medicines for conditions, including cancer, organ transplantation, and autoimmune conditions. Hepatitis C  Blood testing is recommended for:  Everyone born from [birth year] through 1967.  Anyone with known risk factors for hepatitis C. Sexually transmitted infections (STIs)  You should be screened for sexually transmitted infections (STIs) including gonorrhea and chlamydia if:  You are sexually active and are younger than 72 years of age.  You are older than 72 years of age and your health care provider tells you that you are at risk for this type of infection.  Your sexual activity has changed since you were last screened and you are at an increased risk for chlamydia or gonorrhea. Ask your health care provider if you are at risk.  If you do not have HIV, but are at risk, it may be recommended that you take a prescription medicine daily to prevent HIV infection. This is called pre-exposure prophylaxis (PrEP). You are considered at risk if:  You are sexually active and do not regularly  use condoms or know the HIV status of your partner(s).  You take drugs by injection.  You are sexually active with a partner who has HIV. Talk with your health care provider about whether you are at high risk of being infected with HIV. If you choose to begin PrEP, you should first be tested  for HIV. You should then be tested every 3 months for as long as you are taking PrEP.  PREGNANCY   If you are premenopausal and you may become pregnant, ask your health care provider about preconception counseling.  If you may become pregnant, take 400 to 800 micrograms (mcg) of folic acid every day.  If you want to prevent pregnancy, talk to your health care provider about birth control (contraception). OSTEOPOROSIS AND MENOPAUSE   Osteoporosis is a disease in which the bones lose minerals and strength with aging. This can result in serious bone fractures. Your risk for osteoporosis can be identified using a bone density scan.  If you are 61 years of age or older, or if you are at risk for osteoporosis and fractures, ask your health care provider if you should be screened.  Ask your health care provider whether you should take a calcium or vitamin D supplement to lower your risk for osteoporosis.  Menopause may have certain physical symptoms and risks.  Hormone replacement therapy may reduce some of these symptoms and risks. Talk to your health care provider about whether hormone replacement therapy is right for you.  HOME CARE INSTRUCTIONS   Schedule regular health, dental, and eye exams.  Stay current with your immunizations.   Do not use any tobacco products including cigarettes, chewing tobacco, or electronic cigarettes.  If you are pregnant, do not drink alcohol.  If you are breastfeeding, limit how much and how often you drink alcohol.  Limit alcohol intake to no more than 1 drink per day for nonpregnant women. One drink equals 12 ounces of beer, 5 ounces of wine, or 1 ounces of hard  liquor.  Do not use street drugs.  Do not share needles.  Ask your health care provider for help if you need support or information about quitting drugs.  Tell your health care provider if you often feel depressed.  Tell your health care provider if you have ever been abused or do not feel safe at home. Document Released: 10/01/2010 Document Revised: 08/02/2013 Document Reviewed: 02/17/2013 [facility] Patient Information 2015 [place], [place]. This information is not intended to replace advice given to you by your health care provider. Make sure you discuss any questions you have with your health care provider.

## 2015-10-09 DIAGNOSIS — L570 Actinic keratosis: Secondary | ICD-10-CM | POA: Diagnosis not present

## 2015-10-09 DIAGNOSIS — Z85828 Personal history of other malignant neoplasm of skin: Secondary | ICD-10-CM | POA: Diagnosis not present

## 2015-10-09 DIAGNOSIS — L821 Other seborrheic keratosis: Secondary | ICD-10-CM | POA: Diagnosis not present

## 2015-10-09 DIAGNOSIS — D225 Melanocytic nevi of trunk: Secondary | ICD-10-CM | POA: Diagnosis not present

## 2015-10-09 DIAGNOSIS — C44612 Basal cell carcinoma of skin of right upper limb, including shoulder: Secondary | ICD-10-CM | POA: Diagnosis not present

## 2015-10-19 ENCOUNTER — Ambulatory Visit (INDEPENDENT_AMBULATORY_CARE_PROVIDER_SITE_OTHER): Payer: Medicare Other | Admitting: Emergency Medicine

## 2015-10-19 ENCOUNTER — Encounter: Payer: Self-pay | Admitting: Emergency Medicine

## 2015-10-19 VITALS — BP 142/82 | HR 76 | Temp 97.6°F | Resp 16 | Ht 68.0 in | Wt 165.8 lb

## 2015-10-19 DIAGNOSIS — Z1159 Encounter for screening for other viral diseases: Secondary | ICD-10-CM | POA: Diagnosis not present

## 2015-10-19 DIAGNOSIS — R319 Hematuria, unspecified: Secondary | ICD-10-CM | POA: Diagnosis not present

## 2015-10-19 DIAGNOSIS — E559 Vitamin D deficiency, unspecified: Secondary | ICD-10-CM | POA: Diagnosis not present

## 2015-10-19 DIAGNOSIS — M8580 Other specified disorders of bone density and structure, unspecified site: Secondary | ICD-10-CM | POA: Diagnosis not present

## 2015-10-19 DIAGNOSIS — Z0000 Encounter for general adult medical examination without abnormal findings: Secondary | ICD-10-CM

## 2015-10-19 DIAGNOSIS — E785 Hyperlipidemia, unspecified: Secondary | ICD-10-CM

## 2015-10-19 LAB — POC MICROSCOPIC URINALYSIS (UMFC): Mucus: ABSENT

## 2015-10-19 LAB — LIPID PANEL
CHOLESTEROL: 207 mg/dL — AB (ref 125–200)
HDL: 75 mg/dL (ref >=46)
LDL Cholesterol: 111 mg/dL (ref <130)
TRIGLYCERIDES: 106 mg/dL (ref <150)
Total CHOL/HDL Ratio: 2.8 Ratio (ref <=5.0)
VLDL: 21 mg/dL (ref <30)

## 2015-10-19 LAB — POCT URINALYSIS DIP (MANUAL ENTRY)
BILIRUBIN UA: NEGATIVE
Glucose, UA: NEGATIVE
Ketones, POC UA: NEGATIVE
LEUKOCYTES UA: NEGATIVE
Nitrite, UA: NEGATIVE
Protein Ur, POC: NEGATIVE
SPEC GRAV UA: 1.015
Urobilinogen, UA: 0.2
pH, UA: 5.5

## 2015-10-19 LAB — POCT CBC
GRANULOCYTE PERCENT: 58.1 % (ref 37–80)
HCT, POC: 41.2 % (ref 37.7–47.9)
Hemoglobin: 14.2 g/dL (ref 12.2–16.2)
LYMPH, POC: 1.9 (ref 0.6–3.4)
MCH: 29.4 pg (ref 27–31.2)
MCHC: 34.5 g/dL (ref 31.8–35.4)
MCV: 85.1 fL (ref 80–97)
MID (CBC): 0.2 (ref 0–0.9)
MPV: 7.2 fL (ref 0–99.8)
PLATELET COUNT, POC: 197 10*3/uL (ref 142–424)
POC Granulocyte: 3 (ref 2–6.9)
POC LYMPH PERCENT: 38 %L (ref 10–50)
POC MID %: 3.9 %M (ref 0–12)
RBC: 4.84 M/uL (ref 4.04–5.48)
RDW, POC: 13.5 %
WBC: 5.1 10*3/uL (ref 4.6–10.2)

## 2015-10-19 LAB — COMPLETE METABOLIC PANEL WITH GFR
ALT: 15 U/L (ref 6–29)
AST: 17 U/L (ref 10–35)
Albumin: 4.4 g/dL (ref 3.6–5.1)
Alkaline Phosphatase: 68 U/L (ref 33–130)
BUN: 16 mg/dL (ref 7–25)
CALCIUM: 9.3 mg/dL (ref 8.6–10.4)
CHLORIDE: 106 mmol/L (ref 98–110)
CO2: 25 mmol/L (ref 20–31)
Creat: 0.89 mg/dL (ref 0.60–0.93)
GFR, EST AFRICAN AMERICAN: 75 mL/min (ref >=60)
GFR, EST NON AFRICAN AMERICAN: 65 mL/min (ref >=60)
Glucose, Bld: 98 mg/dL (ref 65–99)
POTASSIUM: 3.9 mmol/L (ref 3.5–5.3)
Sodium: 141 mmol/L (ref 135–146)
Total Bilirubin: 0.6 mg/dL (ref 0.2–1.2)
Total Protein: 7.3 g/dL (ref 6.1–8.1)

## 2015-10-19 NOTE — Progress Notes (Addendum)
By signing my name below, I, Debra Frederick, attest that this documentation has been prepared under the direction and in the presence of [name], MD. Electronically Signed: Moises Frederick, [place]. 10/19/2015 , 9:01 AM .  Patient was seen in room 2 .  Chief Complaint:  Chief Complaint  Patient presents with  . Annual Exam    HPI: Debra Frederick is a 70 y.o. female who reports to [facility] today for annual physical.   Vision She saw her eye doctor recently. She had macular eye surgery done in Oct 2016.   OBGYN She's followed by Dr. Phineas Real. Her BP was 120/82 at that visit.   Dermatology She sees dermatologist, Dr. Danella Sensing, once a year; last seen recently, found a basal cell and was removed.   Dentist She saw her dentist recently and her BP was also elevated at that time.   Cancer Screenings Her last colonoscopy was done 2 years ago - 07/16/13.  She had bone density done 2 years ago - 05/06/13.   Hep C screening She hasn't had a Hep C test, and agrees to testing done today.   Exercise She does some cardio, about 30~40 minutes at the gym, and also walks around her neighborhood.   Past Medical History  Diagnosis Date  . Bursitis   . History of shingles 2010  . Osteopenia 07/2015    T score -2.3 FRAX 14%/3.3%  . Cataract   . 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 Dilation and curettage of uterus    . Tubal ligation    . Breast lumpectomy      Benign  . Basal cell excised    . Colonoscopy    . Cataract extraction    . Macular eye surg     Social History   Social History  . Marital Status: Married    Spouse Name: N/A  . Number of Children: N/A  . Years of Education: N/A   Social History Main Topics  . Smoking status: Never Smoker   . Smokeless tobacco: Never Used  . Alcohol Use: 0.0 oz/week    0 Standard drinks or equivalent per week     Comment: Rare  . Drug Use: No  .  Sexual Activity: Not Currently    Birth Control/ Protection: Surgical, Post-menopausal     Comment: BTL-1st intercourse 53 yo-1 partner   Other Topics Concern  . None   Social History Narrative   Family History  Problem Relation Age of Onset  . Breast cancer Mother     Age 6  . Liver cancer Father   . Stroke Maternal Grandmother   . Colon polyps Neg Hx   . Esophageal cancer Neg Hx   . Rectal cancer Neg Hx   . Stomach cancer Neg Hx    Allergies  Allergen Reactions  . Codeine Other (See Comments)    Jittery  . Penicillins Rash   Prior to Admission medications   Medication Sig Start Date End Date Taking? Authorizing Provider  Cholecalciferol (VITAMIN D PO) Take by mouth.    Historical Provider, MD  fish oil-omega-3 fatty acids 1000 MG capsule Take 1 g by mouth daily. Reported on 09/22/2015    Historical Provider, MD  Flaxseed, Linseed, (FLAX SEED OIL PO) Take by mouth.    Historical Provider, MD  vitamin C (ASCORBIC ACID) 500 MG tablet Take 500 mg  by mouth daily.    Historical Provider, MD     ROS:  Constitutional: negative for fever, chills, night sweats, weight changes, or fatigue  HEENT: negative for vision changes, hearing loss, congestion, rhinorrhea, ST, epistaxis, or sinus pressure Cardiovascular: negative for chest pain or palpitations Respiratory: negative for hemoptysis, wheezing, shortness of breath, or cough Abdominal: negative for abdominal pain, nausea, vomiting, diarrhea, or constipation Dermatological: negative for rash Neurologic: negative for headache, dizziness, or syncope All other systems reviewed and are otherwise negative with the exception to those above and in the HPI.  PHYSICAL EXAM: Filed Vitals:   10/19/15 0834 10/19/15 0900  BP: 150/90 142/82  Pulse: 76   Temp: 97.6 F (36.4 C)   Resp: 16    Body mass index is 25.22 kg/(m^2).    Visual Acuity Screening   Right eye Left eye Both eyes  Without correction: 20/200 20/40 20/40  With  correction:       General: Alert, no acute distress HEENT:  Normocephalic, atraumatic, oropharynx patent. Eye: [facility], [facility]; Cataract surgery right eye Cardiovascular:  Regular rate and rhythm, no rubs murmurs or gallops.  No Carotid bruits, radial pulse intact. No pedal edema.  Respiratory: Clear to auscultation bilaterally.  No wheezes, rales, or rhonchi.  No cyanosis, no use of accessory musculature Abdominal: No organomegaly, abdomen is soft and non-tender, positive bowel sounds. No masses. Musculoskeletal: Gait intact. No edema, tenderness Skin: No rashes; Bilateral varicose veins Neurologic: Facial musculature symmetric. Psychiatric: Patient acts appropriately throughout our interaction.  Lymphatic: No cervical or submandibular lymphadenopathy Genitourinary/Anorectal: No acute findings; pelvic exam deferred BP recheck in room, sitting (left arm, manual): 120/80  LABS: Results for orders placed or performed in visit on 10/19/15  POCT CBC  Result Value Ref Range   WBC 5.1 4.6 - 10.2 K/uL   Lymph, poc 1.9 0.6 - 3.4   POC LYMPH PERCENT 38.0 10 - 50 %L   MID (cbc) 0.2 0 - 0.9   POC MID % 3.9 0 - 12 %M   POC Granulocyte 3.0 2 - 6.9   Granulocyte percent 58.1 37 - 80 %G   RBC 4.84 4.04 - 5.48 M/uL   Hemoglobin 14.2 12.2 - 16.2 g/dL   HCT, POC 41.2 37.7 - 47.9 %   MCV 85.1 80 - 97 fL   MCH, POC 29.4 27 - 31.2 pg   MCHC 34.5 31.8 - 35.4 g/dL   RDW, POC 13.5 %   Platelet Count, POC 197 142 - 424 K/uL   MPV 7.2 0 - 99.8 fL  POCT Microscopic Urinalysis ([facility])  Result Value Ref Range   WBC,UR,HPF,POC None None WBC/hpf   RBC,UR,HPF,POC None None RBC/hpf   Bacteria None None, Too numerous to count   Mucus Absent Absent   Epithelial Cells, UR Per Microscopy Few (A) None, Too numerous to count cells/hpf  POCT urinalysis dipstick  Result Value Ref Range   Color, UA yellow yellow   Clarity, UA clear clear   Glucose, UA negative negative   Bilirubin, UA negative negative    Ketones, POC UA negative negative   Spec Grav, UA 1.015    Frederick, UA trace-lysed (A) negative   pH, UA 5.5    Protein Ur, POC negative negative   Urobilinogen, UA 0.2    Nitrite, UA Negative Negative   Leukocytes, UA Negative Negative     EKG/XRAY:     ASSESSMENT/PLAN: Patient is doing well. She'll get physical exam. She is current on immunizations. She is  going to get an automatic Frederick pressure cuff to monitor her pressure at home she had a small basal cell removed from her right arm. 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 The recorded information has been reviewed and is accurate.  Gross sideeffects, risk and benefits, and alternatives of medications d/w patient. Patient is aware that all medications have potential sideeffects and we are unable to predict every sideeffect or drug-drug interaction that may occur.  [name] MD 10/19/2015 9:01 AM

## 2015-10-19 NOTE — Patient Instructions (Signed)
     IF you received an x-ray today, you will receive an invoice from [facility]. Please contact [facility] at [phone] with questions or concerns regarding your invoice.   IF you received labwork today, you will receive an invoice from [facility]. Please contact [facility] at [phone] with questions or concerns regarding your invoice.   Our billing staff will not be able to assist you with questions regarding bills from these companies.  You will be contacted with the lab results as soon as they are available. The fastest way to get your results is to activate your My Chart account. Instructions are located on the last page of this paperwork. If you have not heard from us regarding the results in 2 weeks, please contact this office.      

## 2015-10-20 ENCOUNTER — Telehealth: Payer: Self-pay | Admitting: Emergency Medicine

## 2015-10-20 LAB — VITAMIN D 25 HYDROXY (VIT D DEFICIENCY, FRACTURES): Vit D, 25-Hydroxy: 62 ng/mL (ref 30–100)

## 2015-10-20 LAB — HEPATITIS C ANTIBODY: HCV Ab: NEGATIVE

## 2015-10-20 NOTE — Telephone Encounter (Signed)
-----   Message from Darlyne Russian, MD sent at 10/20/2015  7:41 AM EDT ----- Please forward a copy to Dr. Phineas Real. Labs normal except for mild elevation in cholesterol but better than last year. No change in medication. Be sure she monitors her blood pressure.

## 2015-10-20 NOTE — Telephone Encounter (Signed)
Pt given lab results with instructions to monitor blood pressure, no changes in medication at this time Lab results forwarded to Dr. Phineas Real

## 2015-12-05 DIAGNOSIS — H43812 Vitreous degeneration, left eye: Secondary | ICD-10-CM | POA: Diagnosis not present

## 2015-12-11 DIAGNOSIS — H43812 Vitreous degeneration, left eye: Secondary | ICD-10-CM | POA: Diagnosis not present

## 2015-12-18 ENCOUNTER — Telehealth: Payer: Self-pay | Admitting: Emergency Medicine

## 2015-12-18 NOTE — Telephone Encounter (Signed)
Patient has been checking her blood pressures at home they average between 130 and 140/80-84. Will  hold off on medication at the present time

## 2015-12-26 DIAGNOSIS — H43812 Vitreous degeneration, left eye: Secondary | ICD-10-CM | POA: Diagnosis not present

## 2016-01-24 DIAGNOSIS — H43812 Vitreous degeneration, left eye: Secondary | ICD-10-CM | POA: Diagnosis not present

## 2016-05-23 DIAGNOSIS — H35341 Macular cyst, hole, or pseudohole, right eye: Secondary | ICD-10-CM | POA: Diagnosis not present

## 2016-05-23 DIAGNOSIS — H43812 Vitreous degeneration, left eye: Secondary | ICD-10-CM | POA: Diagnosis not present

## 2016-07-29 DIAGNOSIS — H26491 Other secondary cataract, right eye: Secondary | ICD-10-CM | POA: Diagnosis not present

## 2016-07-29 DIAGNOSIS — H5213 Myopia, bilateral: Secondary | ICD-10-CM | POA: Diagnosis not present

## 2016-07-29 DIAGNOSIS — H35341 Macular cyst, hole, or pseudohole, right eye: Secondary | ICD-10-CM | POA: Diagnosis not present

## 2016-08-01 ENCOUNTER — Ambulatory Visit (INDEPENDENT_AMBULATORY_CARE_PROVIDER_SITE_OTHER): Payer: Medicare Other | Admitting: Family Medicine

## 2016-08-01 ENCOUNTER — Encounter: Payer: Self-pay | Admitting: Family Medicine

## 2016-08-01 VITALS — BP 152/89 | HR 81 | Temp 98.4°F | Resp 16 | Ht 68.0 in | Wt 166.0 lb

## 2016-08-01 DIAGNOSIS — M8589 Other specified disorders of bone density and structure, multiple sites: Secondary | ICD-10-CM

## 2016-08-01 DIAGNOSIS — R3121 Asymptomatic microscopic hematuria: Secondary | ICD-10-CM

## 2016-08-01 DIAGNOSIS — R030 Elevated blood-pressure reading, without diagnosis of hypertension: Secondary | ICD-10-CM | POA: Diagnosis not present

## 2016-08-01 NOTE — Patient Instructions (Addendum)
   IF you received an x-ray today, you will receive an invoice from [facility]. Please contact  Radiology at [phone] with questions or concerns regarding your invoice.   IF you received labwork today, you will receive an invoice from [facility]. Please contact [facility] at [phone] with questions or concerns regarding your invoice.   Our billing staff will not be able to assist you with questions regarding bills from these companies.  You will be contacted with the lab results as soon as they are available. The fastest way to get your results is to activate your My Chart account. Instructions are located on the last page of this paperwork. If you have not heard from us regarding the results in 2 weeks, please contact this office.     DASH Eating Plan DASH stands for "Dietary Approaches to Stop Hypertension." The DASH eating plan is a healthy eating plan that has been shown to reduce high blood pressure (hypertension). It may also reduce your risk for type 2 diabetes, heart disease, and stroke. The DASH eating plan may also help with weight loss. What are tips for following this plan? General guidelines   Avoid eating more than 2,300 mg (milligrams) of salt (sodium) a day. If you have hypertension, you may need to reduce your sodium intake to 1,500 mg a day.  Limit alcohol intake to no more than 1 drink a day for nonpregnant women and 2 drinks a day for men. One drink equals 12 oz of beer, 5 oz of wine, or 1 oz of hard liquor.  Work with your health care provider to maintain a healthy body weight or to lose weight. Ask what an ideal weight is for you.  Get at least 30 minutes of exercise that causes your heart to beat faster (aerobic exercise) most days of the week. Activities may include walking, swimming, or biking.  Work with your health care provider or diet and nutrition specialist (dietitian) to adjust your eating plan to your individual calorie  needs. Reading food labels   Check food labels for the amount of sodium per serving. Choose foods with less than 5 percent of the Daily Value of sodium. Generally, foods with less than 300 mg of sodium per serving fit into this eating plan.  To find whole grains, look for the word "whole" as the first word in the ingredient list. Shopping   Buy products labeled as "low-sodium" or "no salt added."  Buy fresh foods. Avoid canned foods and premade or frozen meals. Cooking   Avoid adding salt when cooking. Use salt-free seasonings or herbs instead of table salt or sea salt. Check with your health care provider or pharmacist before using salt substitutes.  Do not fry foods. Cook foods using healthy methods such as baking, boiling, grilling, and broiling instead.  Cook with heart-healthy oils, such as olive, canola, soybean, or sunflower oil. Meal planning    Eat a balanced diet that includes:  5 or more servings of fruits and vegetables each day. At each meal, try to fill half of your plate with fruits and vegetables.  Up to 6-8 servings of whole grains each day.  Less than 6 oz of lean meat, poultry, or fish each day. A 3-oz serving of meat is about the same size as a deck of cards. One egg equals 1 oz.  2 servings of low-fat dairy each day.  A serving of nuts, seeds, or beans 5 times each week.  Heart-healthy fats. Healthy fats called   Omega-3 fatty acids are found in foods such as flaxseeds and coldwater fish, like sardines, salmon, and mackerel.  Limit how much you eat of the following:  Canned or prepackaged foods.  Food that is high in trans fat, such as fried foods.  Food that is high in saturated fat, such as fatty meat.  Sweets, desserts, sugary drinks, and other foods with added sugar.  Full-fat dairy products.  Do not salt foods before eating.  Try to eat at least 2 vegetarian meals each week.  Eat more home-cooked food and less restaurant, buffet, and fast  food.  When eating at a restaurant, ask that your food be prepared with less salt or no salt, if possible. What foods are recommended? The items listed may not be a complete list. Talk with your dietitian about what dietary choices are best for you. Grains  Whole-grain or whole-wheat bread. Whole-grain or whole-wheat pasta. Brown rice. Oatmeal. Quinoa. Bulgur. Whole-grain and low-sodium cereals. Pita bread. Low-fat, low-sodium crackers. Whole-wheat flour tortillas. Vegetables  Fresh or frozen vegetables (raw, steamed, roasted, or grilled). Low-sodium or reduced-sodium tomato and vegetable juice. Low-sodium or reduced-sodium tomato sauce and tomato paste. Low-sodium or reduced-sodium canned vegetables. Fruits  All fresh, dried, or frozen fruit. Canned fruit in natural juice (without added sugar). Meat and other protein foods  Skinless chicken or turkey. Ground chicken or turkey. Pork with fat trimmed off. Fish and seafood. Egg whites. Dried beans, peas, or lentils. Unsalted nuts, nut butters, and seeds. Unsalted canned beans. Lean cuts of beef with fat trimmed off. Low-sodium, lean deli meat. Dairy  Low-fat (1%) or fat-free (skim) milk. Fat-free, low-fat, or reduced-fat cheeses. Nonfat, low-sodium ricotta or cottage cheese. Low-fat or nonfat yogurt. Low-fat, low-sodium cheese. Fats and oils  Soft margarine without trans fats. Vegetable oil. Low-fat, reduced-fat, or light mayonnaise and salad dressings (reduced-sodium). Canola, safflower, olive, soybean, and sunflower oils. Avocado. Seasoning and other foods  Herbs. Spices. Seasoning mixes without salt. Unsalted popcorn and pretzels. Fat-free sweets. What foods are not recommended? The items listed may not be a complete list. Talk with your dietitian about what dietary choices are best for you. Grains  Baked goods made with fat, such as croissants, muffins, or some breads. Dry pasta or rice meal packs. Vegetables  Creamed or fried vegetables.  Vegetables in a cheese sauce. Regular canned vegetables (not low-sodium or reduced-sodium). Regular canned tomato sauce and paste (not low-sodium or reduced-sodium). Regular tomato and vegetable juice (not low-sodium or reduced-sodium). Pickles. Olives. Fruits  Canned fruit in a light or heavy syrup. Fried fruit. Fruit in cream or butter sauce. Meat and other protein foods  Fatty cuts of meat. Ribs. Fried meat. Bacon. Sausage. Bologna and other processed lunch meats. Salami. Fatback. Hotdogs. Bratwurst. Salted nuts and seeds. Canned beans with added salt. Canned or smoked fish. Whole eggs or egg yolks. Chicken or turkey with skin. Dairy  Whole or 2% milk, cream, and half-and-half. Whole or full-fat cream cheese. Whole-fat or sweetened yogurt. Full-fat cheese. Nondairy creamers. Whipped toppings. Processed cheese and cheese spreads. Fats and oils  Butter. Stick margarine. Lard. Shortening. Ghee. Bacon fat. Tropical oils, such as coconut, palm kernel, or palm oil. Seasoning and other foods  Salted popcorn and pretzels. Onion salt, garlic salt, seasoned salt, table salt, and sea salt. Worcestershire sauce. Tartar sauce. Barbecue sauce. Teriyaki sauce. Soy sauce, including reduced-sodium. Steak sauce. Canned and packaged gravies. Fish sauce. Oyster sauce. Cocktail sauce. Horseradish that you find on the shelf. Ketchup. Mustard. Meat flavorings   flavorings and tenderizers. Bouillon cubes. Hot sauce and Tabasco sauce. Premade or packaged marinades. Premade or packaged taco seasonings. Relishes. Regular salad dressings. Where to find more information:  National Heart, Lung, and [place]: https://wilson-eaton.com/  American Heart Association: www.heart.org Summary  The DASH eating plan is a healthy eating plan that has been shown to reduce high blood pressure (hypertension). It may also reduce your risk for type 2 diabetes, heart disease, and stroke.  With the DASH eating plan, you should limit salt (sodium) intake  to 2,300 mg a day. If you have hypertension, you may need to reduce your sodium intake to 1,500 mg a day.  When on the DASH eating plan, aim to eat more fresh fruits and vegetables, whole grains, lean proteins, low-fat dairy, and heart-healthy fats.  Work with your health care provider or diet and nutrition specialist (dietitian) to adjust your eating plan to your individual calorie needs. This information is not intended to replace advice given to you by your health care provider. Make sure you discuss any questions you have with your health care provider. Document Released: 03/07/2011 Document Revised: 03/11/2016 Document Reviewed: 03/11/2016 Elsevier Interactive Patient Education  2017 [employer]. Managing Your Hypertension Hypertension is commonly called high blood pressure. This is when the force of your blood pressing against the walls of your arteries is too strong. Arteries are blood vessels that carry blood from your heart throughout your body. Hypertension forces the heart to work harder to pump blood, and may cause the arteries to become narrow or stiff. Having untreated or uncontrolled hypertension can cause heart attack, stroke, kidney disease, and other problems. What are blood pressure readings? A blood pressure reading consists of a higher number over a lower number. Ideally, your blood pressure should be below 120/80. The first ("top") number is called the systolic pressure. It is a measure of the pressure in your arteries as your heart beats. The second ("bottom") number is called the diastolic pressure. It is a measure of the pressure in your arteries as the heart relaxes. What does my blood pressure reading mean? Blood pressure is classified into four stages. Based on your blood pressure reading, your health care provider may use the following stages to determine what type of treatment you need, if any. Systolic pressure and diastolic pressure are measured in a unit called mm  Hg. Normal   Systolic pressure: below 284.  Diastolic pressure: below 80. Elevated   Systolic pressure: 132-440.  Diastolic pressure: below 80. Hypertension stage 1     Diastolic pressure: 10-27. Hypertension stage 2   Systolic pressure: 253 or above.  Diastolic pressure: 90 or above. What health risks are associated with hypertension? Managing your hypertension is an important responsibility. Uncontrolled hypertension can lead to:  A heart attack.  A stroke.  A weakened blood vessel (aneurysm).  Heart failure.  Kidney damage.  Eye damage.  Metabolic syndrome.  Memory and concentration problems. What changes can I make to manage my hypertension? Eating and drinking   Eat a diet that is high in fiber and potassium, and low in salt (sodium), added sugar, and fat. An example eating plan is called the DASH (Dietary Approaches to Stop Hypertension) diet. To eat this way:  Eat plenty of fresh fruits and vegetables. Try to fill half of your plate at each meal with fruits and vegetables.  Eat whole grains, such as whole wheat pasta, brown rice, or whole grain bread. Fill about one quarter of your plate with whole  grains.  Eat low-fat diary products.  Avoid fatty cuts of meat, processed or cured meats, and poultry with skin. Fill about one quarter of your plate with lean proteins such as fish, chicken without skin, beans, eggs, and tofu.  Avoid premade and processed foods. These tend to be higher in sodium, added sugar, and fat.     Lifestyle   Work with your health care provider to maintain a healthy body weight, or to lose weight. Ask what an ideal weight is for you.  Get at least 30 minutes of exercise that causes your heart to beat faster (aerobic exercise) most days of the week. Activities may include walking, swimming, or biking.       Monitoring   Monitor your blood pressure at home as told by your health care provider. Your personal target blood  pressure may vary depending on your medical conditions, your age, and other factors.  Have your blood pressure checked regularly, as often as told by your health care provider. Working with your health care provider   Review all the medicines you take with your health care provider because there may be side effects or interactions.  Talk with your health care provider about your diet, exercise habits, and other lifestyle factors that may be contributing to hypertension.  Visit your health care provider regularly. Your health care provider can help you create and adjust your plan for managing hypertension. Will I need medicine to control my blood pressure? Your health care provider may prescribe medicine if lifestyle changes are not enough to get your blood pressure under control, and if:  Your systolic blood pressure is 130 or higher.  Your diastolic blood pressure is 80 or higher. Take medicines only as told by your health care provider. Follow the directions carefully. Blood pressure medicines must be taken as prescribed. The medicine does not work as well when you skip doses. Skipping doses also puts you at risk for problems. Contact a health care provider if:  You think you are having a reaction to medicines you have taken.  You have repeated (recurrent) headaches.  You feel dizzy.  You have swelling in your ankles.  You have trouble with your vision. Get help right away if:  You develop a severe headache or confusion.  You have unusual weakness or numbness, or you feel faint.  You have severe pain in your chest or abdomen.  You vomit repeatedly.  You have trouble breathing. Summary  Hypertension is when the force of blood pumping through your arteries is too strong. If this condition is not controlled, it may put you at risk for serious complications.  Your personal target blood pressure may vary depending on your medical conditions, your age, and other factors. For  most people, a normal blood pressure is less than 120/80.  Hypertension is managed by lifestyle changes, medicines, or both. Lifestyle changes include weight loss, eating a healthy, low-sodium diet, exercising more, and limiting alcohol. This information is not intended to replace advice given to you by your health care provider. Make sure you discuss any questions you have with your health care provider. Document Released: 12/11/2011 Document Revised: 02/14/2016 Document Reviewed: 02/14/2016 Elsevier Interactive Patient Education  2017 [employer].

## 2016-08-01 NOTE — Progress Notes (Signed)
Subjective:    Patient ID: Debra Frederick, female    DOB: Oct 20, 1945, 71 y.o.   MRN: [id number] Chief Complaint  Patient presents with  . Establish Care    HPI  Debra Frederick is a 71 year old woman here today to establish care. She is normally seen annually by my retired Social worker Dr. Everlene Farrier for her Medicare wellness visit every July.  Osteopenia:  Goes to the gym daily and yoga.  Vit D def: On 5000u/d vit D.  Gets calcium in though her diet.  Elev BP: Checks Bp at home prior but not currently. Was not  Hematuria: none on last microscopy  Sees dermatology reguilarly - Dr. Ronnald Ramp Eye doctor Dentist.   Past Medical History:  Diagnosis Date  . Bursitis   . Cancer ([place])    basal cell CA x2- thigh and left chest-90's  . Cataract   . History of shingles 2010  . Osteopenia 07/2015   T score -2.3 FRAX 14%/3.3%   Past Surgical History:  Procedure Laterality Date  . basal cell excised    . BREAST LUMPECTOMY     Benign  . CATARACT EXTRACTION    . COLONOSCOPY    . DILATION AND CURETTAGE OF UTERUS    . macular eye surg    . TUBAL LIGATION     Current Outpatient Prescriptions on File Prior to Visit  Medication Sig Dispense Refill  . Cholecalciferol (VITAMIN D PO) Take by mouth.    . fish oil-omega-3 fatty acids 1000 MG capsule Take 1 g by mouth daily. Reported on 09/22/2015    . Flaxseed, Linseed, (FLAX SEED OIL PO) Take by mouth.    . vitamin C (ASCORBIC ACID) 500 MG tablet Take 500 mg by mouth daily.     No current facility-administered medications on file prior to visit.    Allergies  Allergen Reactions  . Codeine Other (See Comments)    Jittery  . Penicillins Rash   Family History  Problem Relation Age of Onset  . Breast cancer Mother     Age 26  . Liver cancer Father   . Stroke Maternal Grandmother   . Colon polyps Neg Hx   . Esophageal cancer Neg Hx   . Rectal cancer Neg Hx   . Stomach cancer Neg Hx    Social History   Social History  . Marital status: Married      Spouse name: N/A  . Number of children: N/A  . Years of education: N/A   Social History Main Topics  . Smoking status: Never Smoker  . Smokeless tobacco: Never Used  . Alcohol use 0.0 oz/week     Comment: Rare  . Drug use: No  . Sexual activity: Not Currently    Birth control/ protection: Surgical, Post-menopausal     Comment: BTL-1st intercourse 72 yo-1 partner   Other Topics Concern  . None   Social History Narrative  . None   Depression screen [facility] 2/9 08/01/2016 10/19/2015 10/13/2014  Decreased Interest 0 0 0  Down, Depressed, Hopeless 0 0 0  PHQ - 2 Score 0 0 0    Review of Systems See hpi    Objective:   Physical Exam  Constitutional: She is oriented to person, place, and time. She appears well-developed and well-nourished. No distress.  HENT:  Head: Normocephalic and atraumatic.  Right Ear: External ear normal.  Left Ear: External ear normal.  Eyes: Conjunctivae are normal. No scleral icterus.  Neck: Normal range of motion. Neck  supple. No thyromegaly present.  Cardiovascular: Normal rate, regular rhythm, normal heart sounds and intact distal pulses.   Pulmonary/Chest: Effort normal and breath sounds normal. No respiratory distress.  Musculoskeletal: She exhibits no edema.  Lymphadenopathy:    She has no cervical adenopathy.  Neurological: She is alert and oriented to person, place, and time.  Skin: Skin is warm and dry. She is not diaphoretic. No erythema.  Psychiatric: She has a normal mood and affect. Her behavior is normal.         BP (!) 152/89   Pulse 81   Temp 98.4 F (36.9 C) (Oral)   Resp 16   Ht 5\' 8"  (1.727 m)   Wt 166 lb (75.3 kg)   SpO2 97%   BMI 25.24 kg/m   Assessment & Plan:  Sched for AWV in July  1. Osteopenia of multiple sites   2. Asymptomatic microscopic hematuria   3. Elevated blood pressure reading   4. White coat syndrome without diagnosis of hypertension      Delman Cheadle, M.D.  Primary Care at [facility] [address] [place], [place] [address] [phone] phone [phone] fax  08/03/16 11:54 PM

## 2016-09-24 ENCOUNTER — Encounter: Payer: Self-pay | Admitting: Gynecology

## 2016-09-24 DIAGNOSIS — Z803 Family history of malignant neoplasm of breast: Secondary | ICD-10-CM | POA: Diagnosis not present

## 2016-09-24 DIAGNOSIS — Z1231 Encounter for screening mammogram for malignant neoplasm of breast: Secondary | ICD-10-CM | POA: Diagnosis not present

## 2016-10-08 DIAGNOSIS — C44319 Basal cell carcinoma of skin of other parts of face: Secondary | ICD-10-CM | POA: Diagnosis not present

## 2016-10-08 DIAGNOSIS — C44519 Basal cell carcinoma of skin of other part of trunk: Secondary | ICD-10-CM | POA: Diagnosis not present

## 2016-10-08 DIAGNOSIS — Z85828 Personal history of other malignant neoplasm of skin: Secondary | ICD-10-CM | POA: Diagnosis not present

## 2016-10-08 DIAGNOSIS — L821 Other seborrheic keratosis: Secondary | ICD-10-CM | POA: Diagnosis not present

## 2016-10-22 NOTE — Progress Notes (Signed)
Subjective:   Chief Complaint  Patient presents with  . Annual Exam    CPE      Debra Frederick is a 71 y.o. female who presents for Medicare Annual/Subsequent preventive examination.     Vacationed to visit her son in [place] so tons of walking - walked 27K steps in one day - almost 10 mi. 16 flights of stairs  Preventive Screening-Counseling & Management  Tobacco History  Smoking Status  . Never Smoker  Smokeless Tobacco  . Never Used     Problems Prior to Visit 1.  Elevated blood pressure reading attributed to white coat syndrome - has she checked bp at home religiously over the pat month, at times sev times a day and in different situations and brought in log.  All are excellent. BP 110-130s/70-80s. Large majority 120s/low 80s.  Current Problems (verified) Patient Active Problem List   Diagnosis Date Noted  . Hematuria - resolved on last microscopy 10/13/2014  . Bursitis   . History of shingles   . Osteopenia: Goes to the gym daily and yoga 3x/wk   . Vitamin D deficiency: On 5000u/d vit D.  Gets calcium in though her diet.  Lab Results  Component Value Date   VD25OH 62 10/19/2015   VD25OH 44 10/13/2014   VD25OH 59 06/22/2013       Medications Prior to Visit Current Outpatient Prescriptions on File Prior to Visit  Medication Sig Dispense Refill  . Cholecalciferol (VITAMIN D PO) Take by mouth.    . fish oil-omega-3 fatty acids 1000 MG capsule Take 1 g by mouth daily. Reported on 09/22/2015    . Flaxseed, Linseed, (FLAX SEED OIL PO) Take by mouth.    . vitamin C (ASCORBIC ACID) 500 MG tablet Take 500 mg by mouth daily.     No current facility-administered medications on file prior to visit.     Current Medications (verified) Current Outpatient Prescriptions  Medication Sig Dispense Refill  . Cholecalciferol (VITAMIN D PO) Take by mouth.    . fish oil-omega-3 fatty acids 1000 MG capsule Take 1 g by mouth daily. Reported on 09/22/2015    . Flaxseed, Linseed, (FLAX  SEED OIL PO) Take by mouth.    . vitamin C (ASCORBIC ACID) 500 MG tablet Take 500 mg by mouth daily.     No current facility-administered medications for this visit.      Allergies (verified) Codeine and Penicillins   PAST HISTORY  Family History Family History  Problem Relation Age of Onset  . Breast cancer Mother        Age 71  . Liver cancer Father   . Stroke Maternal Grandmother   . Colon polyps Neg Hx   . Esophageal cancer Neg Hx   . Rectal cancer Neg Hx   . Stomach cancer Neg Hx     Social History Social History  Substance Use Topics  . Smoking status: Never Smoker  . Smokeless tobacco: Never Used  . Alcohol use 0.0 oz/week     Comment: Rare     Are there smokers in your home (other than you)? No  Risk Factors Current exercise habits: Gym/ health club routine includes cardio, walking on track  and yoga.  Dietary issues discussed: heart healthy, low sodium   Cardiac risk factors: advanced age (older than 40 for men, 39 for women).  Depression Screen Depression screen [facility] 2/9 10/24/2016 08/01/2016 10/19/2015 10/13/2014  Decreased Interest 0 0 0 0  Down, Depressed, Hopeless 0 0  0 0  PHQ - 2 Score 0 0 0 0     Activities of Daily Living In your present state of health, do you have any difficulty performing the following activities?:  Driving? No Managing money?  No Feeding yourself? No Getting from bed to chair? No Climbing a flight of stairs? No Preparing food and eating?: No Bathing or showering? No Getting dressed: No Getting to the toilet? No Using the toilet:No Moving around from place to place: No In the past year have you fallen or had a near fall?:No   Are you sexually active?  Yes  Do you have more than one partner?  No  Hearing Difficulties: Yes Do you often ask people to speak up or repeat themselves? Yes - when there is a lot of background noises Do you experience ringing or noises in your ears? Yes - long-standing and not worsening. Do  you have difficulty understanding soft or whispered voices? Yes - sometimes   Do you feel that you have a problem with memory? Yes - names but chronic  Do you often misplace items? No  Do you feel safe at home?  Yes  Cognitive Testing  Alert? Yes  Normal Appearance?Yes  Oriented to person? Yes  Place? Yes   Time? Yes  Recall of three objects?  Yes  Can perform simple calculations? Yes  Displays appropriate judgment?Yes  Can read the correct time from a watch face?Yes   Advanced Directives have been discussed with the patient? Yes - fine with life-sustaining measures unless poor quality. Has prior HCPOA forms filled out many years ago but doesn't remember who it named.  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 GI at [facility] (Dr. Delfin Edis)   Indicate any recent Medical Services you may have received from other than [facility] providers in the past year (date may be approximate). Immunization History  Administered Date(s) Administered  . Influenza Split 12/29/2015  . Influenza Whole 12/18/2011  . Influenza-Unspecified 12/30/2012, 12/20/2013, 12/29/2014  . Pneumococcal Conjugate-13 10/13/2014  . Pneumococcal Polysaccharide-23 10/02/2010  . Tdap 10/13/2007  . Zoster 04/01/2010    Screening Tests Health Maintenance  Topic Date Due  . INFLUENZA VACCINE  10/30/2016  . TETANUS/TDAP  10/12/2017  . MAMMOGRAM  09/25/2018  . COLONOSCOPY  01/01/2024  . DEXA SCAN  Completed  . Hepatitis C Screening  Completed  . PNA vac Low Risk Adult  Completed    All answers were reviewed with the patient and necessary referrals were made:  Joneric Streight,  MD   10/22/2016   History reviewed: allergies, current medications, past family history, past medical history, past social history, past surgical history and problem list  Review of Systems  Pertinent items are noted in HPI.    Objective:  BP (!) 150/84   Pulse 80   Temp 97.8 F (36.6 C) (Oral)   Resp 18   Ht 5\' 8"  (1.727 m)   Wt 163 lb (73.9 kg)   SpO2 97%   BMI 24.78 kg/m  Vision Screening Comments: Unable to complete test due to having eye surgery can only use the one eye and could not complete test   General Appearance:  Alert, cooperative, no distress, appears stated age  Head:    Normocephalic, without obvious abnormality, atraumatic  Eyes:    PERRL, conjunctiva/corneas clear, EOM's intact both eyes  Ears:    Normal TM's and external ear canals, both ears  Nose:   Nares normal, septum midline, mucosa normal, no drainage    or sinus tenderness  Throat:   Lips, mucosa, and tongue normal; teeth and gums normal  Neck:   Supple, symmetrical, trachea midline, no adenopathy;    thyroid:  no enlargement/tenderness/nodules  Back:     Symmetric, no curvature, no CVA tenderness  Lungs:     Clear to auscultation bilaterally, respirations unlabored  Chest Wall:    No tenderness or deformity   Heart:    Regular rate and rhythm, S1 and S2 normal, no murmur, rub   or gallop     Abdomen:     Soft, non-tender, bowel sounds active all four quadrants,    no masses, no organomegaly        Extremities:   Extremities normal, atraumatic, no cyanosis or edema  Pulses:   2+ and symmetric all extremities  Skin:   Skin color, texture, turgor normal, no rashes or lesions  Lymph nodes:   Cervical, supraclavicular, and axillary nodes normal  Neurologic:   CNII-XII intact, grossly normal strength, sensation throughout       Assessment:   1. Medicare annual wellness visit, subsequent   2. Screening for cardiovascular, respiratory, and genitourinary diseases   3. Screening for colorectal cancer    4. Screening for deficiency anemia   5. Osteopenia of multiple sites   6. Vitamin D deficiency   7. Asymptomatic microscopic hematuria    Plan:  Patient is in excellent health and is generally seen annually for her wellness visit only. Ua, cbc, cmp, lipid No need to check vit D- level gets better every year and has been nomrla for > 3 yrs tsh last checked 2 years ago and was normal   During the course of the visit the patient was educated and counseled about appropriate screening and preventive services including:   Health Maintenance  Topic Date Due  . INFLUENZA VACCINE  10/30/2016  . TETANUS/TDAP  10/12/2017  . MAMMOGRAM  09/25/2018  . COLONOSCOPY  01/01/2024  . DEXA SCAN  Completed  . Hepatitis C Screening  Completed  . PNA vac Low Risk Adult  Completed     Consider shingrex - had zostavax in 2012  Repeat dexa scan with mammogram in June 2019.   Diet review for nutrition referral? Yes ____  Not Indicated _x___   Patient Instructions (the written plan) was given to the patient.  Medicare Attestation I have personally reviewed: The patient's medical and social history Their use of alcohol, tobacco or illicit drugs Their current medications and supplements The patient's functional ability including ADLs,fall risks, home safety risks, cognitive, and hearing and visual impairment Diet and physical activities Evidence for depression or mood disorders  The patient's weight, height, BMI, and visual acuity have been recorded in the chart.  I have made referrals, counseling, and provided education to the patient based on review of the above and I have provided the patient with a written personalized care plan for preventive services.     Delman Cheadle, MD   10/22/2016    Orders Placed This Encounter  Procedures  . Lipid panel    Order Specific Question:   Has the patient fasted?    Answer:   Yes  .  Comprehensive metabolic panel    Order Specific Question:   Has the  patient fasted?    Answer:   Yes  . CBC  . VITAMIN D 25 Hydroxy (Vit-D Deficiency, Fractures)  . VITAMIN D 25 Hydroxy (Vit-D Deficiency, Fractures)  . POCT urinalysis dipstick    Delman Cheadle, M.D.  Primary Care at [facility] [address] [place], [place] [address] [phone] phone [phone] fax  10/26/16 10:08 PM

## 2016-10-24 ENCOUNTER — Encounter: Payer: Self-pay | Admitting: Family Medicine

## 2016-10-24 ENCOUNTER — Ambulatory Visit (INDEPENDENT_AMBULATORY_CARE_PROVIDER_SITE_OTHER): Payer: Medicare Other | Admitting: Family Medicine

## 2016-10-24 VITALS — BP 150/84 | HR 80 | Temp 97.8°F | Resp 18 | Ht 68.0 in | Wt 163.0 lb

## 2016-10-24 DIAGNOSIS — Z130 Encounter for screening for diseases of the blood and blood-forming organs and certain disorders involving the immune mechanism: Secondary | ICD-10-CM | POA: Diagnosis not present

## 2016-10-24 DIAGNOSIS — Z1212 Encounter for screening for malignant neoplasm of rectum: Secondary | ICD-10-CM

## 2016-10-24 DIAGNOSIS — R3121 Asymptomatic microscopic hematuria: Secondary | ICD-10-CM

## 2016-10-24 DIAGNOSIS — Z136 Encounter for screening for cardiovascular disorders: Secondary | ICD-10-CM | POA: Diagnosis not present

## 2016-10-24 DIAGNOSIS — Z0000 Encounter for general adult medical examination without abnormal findings: Secondary | ICD-10-CM | POA: Diagnosis not present

## 2016-10-24 DIAGNOSIS — Z1211 Encounter for screening for malignant neoplasm of colon: Secondary | ICD-10-CM | POA: Diagnosis not present

## 2016-10-24 DIAGNOSIS — M8589 Other specified disorders of bone density and structure, multiple sites: Secondary | ICD-10-CM | POA: Diagnosis not present

## 2016-10-24 DIAGNOSIS — Z1389 Encounter for screening for other disorder: Secondary | ICD-10-CM

## 2016-10-24 DIAGNOSIS — Z1383 Encounter for screening for respiratory disorder NEC: Secondary | ICD-10-CM | POA: Diagnosis not present

## 2016-10-24 DIAGNOSIS — E559 Vitamin D deficiency, unspecified: Secondary | ICD-10-CM

## 2016-10-24 LAB — POCT URINALYSIS DIP (MANUAL ENTRY)
BILIRUBIN UA: NEGATIVE
GLUCOSE UA: NEGATIVE mg/dL
Ketones, POC UA: NEGATIVE mg/dL
LEUKOCYTES UA: NEGATIVE
NITRITE UA: NEGATIVE
PH UA: 7 (ref 5.0–8.0)
Protein Ur, POC: NEGATIVE mg/dL
Spec Grav, UA: 1.01 (ref 1.010–1.025)
UROBILINOGEN UA: 0.2 U/dL

## 2016-10-24 NOTE — Patient Instructions (Addendum)
   IF you received an x-ray today, you will receive an invoice from [facility]. Please contact [facility] at [phone] with questions or concerns regarding your invoice.   IF you received labwork today, you will receive an invoice from [facility]. Please contact [facility] at [phone] with questions or concerns regarding your invoice.   Our billing staff will not be able to assist you with questions regarding bills from these companies.  You will be contacted with the lab results as soon as they are available. The fastest way to get your results is to activate your My Chart account. Instructions are located on the last page of this paperwork. If you have not heard from us regarding the results in 2 weeks, please contact this office.     Health Maintenance for Postmenopausal Women Menopause is a normal process in which your reproductive ability comes to an end. This process happens gradually over a span of months to years, usually between the ages of 48 and 55. Menopause is complete when you have missed 12 consecutive menstrual periods. It is important to talk with your health care provider about some of the most common conditions that affect postmenopausal women, such as heart disease, cancer, and bone loss (osteoporosis). Adopting a healthy lifestyle and getting preventive care can help to promote your health and wellness. Those actions can also lower your chances of developing some of these common conditions. What should I know about menopause? During menopause, you may experience a number of symptoms, such as:  Moderate-to-severe hot flashes.  Night sweats.  Decrease in sex drive.  Mood swings.  Headaches.  Tiredness.  Irritability.  Memory problems.  Insomnia.  Choosing to treat or not to treat menopausal changes is an individual decision that you make with your health care provider. What should I know about hormone replacement therapy and  supplements? Hormone therapy products are effective for treating symptoms that are associated with menopause, such as hot flashes and night sweats. Hormone replacement carries certain risks, especially as you become older. If you are thinking about using estrogen or estrogen with progestin treatments, discuss the benefits and risks with your health care provider. What should I know about heart disease and stroke? Heart disease, heart attack, and stroke become more likely as you age. This may be due, in part, to the hormonal changes that your body experiences during menopause. These can affect how your body processes dietary fats, triglycerides, and cholesterol. Heart attack and stroke are both medical emergencies. There are many things that you can do to help prevent heart disease and stroke:  Have your blood pressure checked at least every 1-2 years. High blood pressure causes heart disease and increases the risk of stroke.  If you are 55-79 years old, ask your health care provider if you should take aspirin to prevent a heart attack or a stroke.  Do not use any tobacco products, including cigarettes, chewing tobacco, or electronic cigarettes. If you need help quitting, ask your health care provider.  It is important to eat a healthy diet and maintain a healthy weight. ? Be sure to include plenty of vegetables, fruits, low-fat dairy products, and lean protein. ? Avoid eating foods that are high in solid fats, added sugars, or salt (sodium).  Get regular exercise. This is one of the most important things that you can do for your health. ? Try to exercise for at least 150 minutes each week. The type of exercise that you do should increase your   heart rate and make you sweat. This is known as moderate-intensity exercise. ? Try to do strengthening exercises at least twice each week. Do these in addition to the moderate-intensity exercise.  Know your numbers.Ask your health care provider to check  your cholesterol and your blood glucose. Continue to have your blood tested as directed by your health care provider.  What should I know about cancer screening? There are several types of cancer. Take the following steps to reduce your risk and to catch any cancer development as early as possible. Breast Cancer  Practice breast self-awareness. ? This means understanding how your breasts normally appear and feel. ? It also means doing regular breast self-exams. Let your health care provider know about any changes, no matter how small.  If you are 40 or older, have a clinician do a breast exam (clinical breast exam or CBE) every year. Depending on your age, family history, and medical history, it may be recommended that you also have a yearly breast X-ray (mammogram).  If you have a family history of breast cancer, talk with your health care provider about genetic screening.  If you are at high risk for breast cancer, talk with your health care provider about having an MRI and a mammogram every year.  Breast cancer (BRCA) gene test is recommended for women who have family members with BRCA-related cancers. Results of the assessment will determine the need for genetic counseling and BRCA1 and for BRCA2 testing. BRCA-related cancers include these types: ? Breast. This occurs in males or females. ? Ovarian. ? Tubal. This may also be called fallopian tube cancer. ? Cancer of the abdominal or pelvic lining (peritoneal cancer). ? Prostate. ? Pancreatic.  Cervical, Uterine, and Ovarian Cancer Your health care provider may recommend that you be screened regularly for cancer of the pelvic organs. These include your ovaries, uterus, and vagina. This screening involves a pelvic exam, which includes checking for microscopic changes to the surface of your cervix (Pap test).  For women ages 21-65, health care providers may recommend a pelvic exam and a Pap test every three years. For women ages 30-65,  they may recommend the Pap test and pelvic exam, combined with testing for human papilloma virus (HPV), every five years. Some types of HPV increase your risk of cervical cancer. Testing for HPV may also be done on women of any age who have unclear Pap test results.  Other health care providers may not recommend any screening for nonpregnant women who are considered low risk for pelvic cancer and have no symptoms. Ask your health care provider if a screening pelvic exam is right for you.  If you have had past treatment for cervical cancer or a condition that could lead to cancer, you need Pap tests and screening for cancer for at least 20 years after your treatment. If Pap tests have been discontinued for you, your risk factors (such as having a new sexual partner) need to be reassessed to determine if you should start having screenings again. Some women have medical problems that increase the chance of getting cervical cancer. In these cases, your health care provider may recommend that you have screening and Pap tests more often.  If you have a family history of uterine cancer or ovarian cancer, talk with your health care provider about genetic screening.  If you have vaginal bleeding after reaching menopause, tell your health care provider.  There are currently no reliable tests available to screen for ovarian cancer.  Lung   Cancer Lung cancer screening is recommended for adults 55-80 years old who are at high risk for lung cancer because of a history of smoking. A yearly low-dose CT scan of the lungs is recommended if you:  Currently smoke.  Have a history of at least 30 pack-years of smoking and you currently smoke or have quit within the past 15 years. A pack-year is smoking an average of one pack of cigarettes per day for one year.  Yearly screening should:  Continue until it has been 15 years since you quit.  Stop if you develop a health problem that would prevent you from having lung  cancer treatment.  Colorectal Cancer  This type of cancer can be detected and can often be prevented.  Routine colorectal cancer screening usually begins at age 50 and continues through age 75.  If you have risk factors for colon cancer, your health care provider may recommend that you be screened at an earlier age.  If you have a family history of colorectal cancer, talk with your health care provider about genetic screening.  Your health care provider may also recommend using home test kits to check for hidden blood in your stool.  A small camera at the end of a tube can be used to examine your colon directly (sigmoidoscopy or colonoscopy). This is done to check for the earliest forms of colorectal cancer.  Direct examination of the colon should be repeated every 5-10 years until age 75. However, if early forms of precancerous polyps or small growths are found or if you have a family history or genetic risk for colorectal cancer, you may need to be screened more often.  Skin Cancer  Check your skin from head to toe regularly.  Monitor any moles. Be sure to tell your health care provider: ? About any new moles or changes in moles, especially if there is a change in a mole's shape or color. ? If you have a mole that is larger than the size of a pencil eraser.  If any of your family members has a history of skin cancer, especially at a young age, talk with your health care provider about genetic screening.  Always use sunscreen. Apply sunscreen liberally and repeatedly throughout the day.  Whenever you are outside, protect yourself by wearing long sleeves, pants, a wide-brimmed hat, and sunglasses.  What should I know about osteoporosis? Osteoporosis is a condition in which bone destruction happens more quickly than new bone creation. After menopause, you may be at an increased risk for osteoporosis. To help prevent osteoporosis or the bone fractures that can happen because of  osteoporosis, the following is recommended:  If you are 19-50 years old, get at least 1,000 mg of calcium and at least 600 mg of vitamin D per day.  If you are older than age 50 but younger than age 70, get at least 1,200 mg of calcium and at least 600 mg of vitamin D per day.  If you are older than age 70, get at least 1,200 mg of calcium and at least 800 mg of vitamin D per day.  Smoking and excessive alcohol intake increase the risk of osteoporosis. Eat foods that are rich in calcium and vitamin D, and do weight-bearing exercises several times each week as directed by your health care provider. What should I know about how menopause affects my mental health? Depression may occur at any age, but it is more common as you become older. Common symptoms of depression   include:  Low or sad mood.  Changes in sleep patterns.  Changes in appetite or eating patterns.  Feeling an overall lack of motivation or enjoyment of activities that you previously enjoyed.  Frequent crying spells.  Talk with your health care provider if you think that you are experiencing depression. What should I know about immunizations? It is important that you get and maintain your immunizations. These include:  Tetanus, diphtheria, and pertussis (Tdap) booster vaccine.  Influenza every year before the flu season begins.  Pneumonia vaccine.  Shingles vaccine.  Your health care provider may also recommend other immunizations. This information is not intended to replace advice given to you by your health care provider. Make sure you discuss any questions you have with your health care provider. Document Released: 05/10/2005 Document Revised: 10/06/2015 Document Reviewed: 12/20/2014 Elsevier Interactive Patient Education  2018 [employer]  

## 2016-10-25 LAB — COMPREHENSIVE METABOLIC PANEL
ALK PHOS: 78 IU/L (ref 39–117)
ALT: 13 IU/L (ref 0–32)
AST: 17 IU/L (ref 0–40)
Albumin/Globulin Ratio: 1.8 (ref 1.2–2.2)
Albumin: 4.6 g/dL (ref 3.5–4.8)
BILIRUBIN TOTAL: 0.5 mg/dL (ref 0.0–1.2)
BUN / CREAT RATIO: 16 (ref 12–28)
BUN: 15 mg/dL (ref 8–27)
CHLORIDE: 103 mmol/L (ref 96–106)
CO2: 24 mmol/L (ref 20–29)
CREATININE: 0.96 mg/dL (ref 0.57–1.00)
Calcium: 9.8 mg/dL (ref 8.7–10.3)
GFR calc Af Amer: 68 mL/min/{1.73_m2} (ref >59)
GFR calc non Af Amer: 59 mL/min/{1.73_m2} — ABNORMAL LOW (ref >59)
GLUCOSE: 103 mg/dL — AB (ref 65–99)
Globulin, Total: 2.6 g/dL (ref 1.5–4.5)
Potassium: 3.9 mmol/L (ref 3.5–5.2)
Sodium: 142 mmol/L (ref 134–144)
Total Protein: 7.2 g/dL (ref 6.0–8.5)

## 2016-10-25 LAB — LIPID PANEL
CHOL/HDL RATIO: 3.1 ratio (ref 0.0–4.4)
CHOLESTEROL TOTAL: 236 mg/dL — AB (ref 100–199)
HDL: 75 mg/dL (ref >39)
LDL CALC: 137 mg/dL — AB (ref 0–99)
Triglycerides: 119 mg/dL (ref 0–149)
VLDL CHOLESTEROL CAL: 24 mg/dL (ref 5–40)

## 2016-10-25 LAB — CBC
HEMATOCRIT: 44.5 % (ref 34.0–46.6)
Hemoglobin: 14.3 g/dL (ref 11.1–15.9)
MCH: 28.3 pg (ref 26.6–33.0)
MCHC: 32.1 g/dL (ref 31.5–35.7)
MCV: 88 fL (ref 79–97)
Platelets: 225 10*3/uL (ref 150–379)
RBC: 5.06 x10E6/uL (ref 3.77–5.28)
RDW: 14.7 % (ref 12.3–15.4)
WBC: 4.9 10*3/uL (ref 3.4–10.8)

## 2016-10-25 LAB — VITAMIN D 25 HYDROXY (VIT D DEFICIENCY, FRACTURES): VIT D 25 HYDROXY: 44.4 ng/mL (ref 30.0–100.0)

## 2016-11-15 ENCOUNTER — Encounter: Payer: Self-pay | Admitting: Gynecology

## 2016-11-15 ENCOUNTER — Ambulatory Visit (INDEPENDENT_AMBULATORY_CARE_PROVIDER_SITE_OTHER): Payer: Medicare Other | Admitting: Gynecology

## 2016-11-15 VITALS — BP 144/84 | Ht 68.0 in | Wt 164.0 lb

## 2016-11-15 DIAGNOSIS — N816 Rectocele: Secondary | ICD-10-CM

## 2016-11-15 DIAGNOSIS — Z01411 Encounter for gynecological examination (general) (routine) with abnormal findings: Secondary | ICD-10-CM

## 2016-11-15 DIAGNOSIS — N952 Postmenopausal atrophic vaginitis: Secondary | ICD-10-CM | POA: Diagnosis not present

## 2016-11-15 DIAGNOSIS — M8580 Other specified disorders of bone density and structure, unspecified site: Secondary | ICD-10-CM

## 2016-11-15 NOTE — Patient Instructions (Signed)
Follow up in one year, sooner as needed. 

## 2016-11-15 NOTE — Progress Notes (Signed)
    MARYTZA GRANDPRE Jan 01, 1946 [id number]        71 y.o.  [id number] for annual exam.    Past medical history,surgical history, problem list, medications, allergies, family history and social history were all reviewed and documented as reviewed in the EPIC chart.  ROS:  Performed with pertinent positives and negatives included in the history, assessment and plan.   Additional significant findings :  None   Exam: [name] assistant Vitals:   11/15/16 1419  BP: (!) 144/84  Weight: 164 lb (74.4 kg)  Height: 5\' 8"  (1.727 m)   Body mass index is 24.94 kg/m.  General appearance:  Normal affect, orientation and appearance. Skin: Grossly normal HEENT: Without gross lesions.  No cervical or supraclavicular adenopathy. Thyroid normal.  Lungs:  Clear without wheezing, rales or rhonchi Cardiac: RR, without RMG Abdominal:  Soft, nontender, without masses, guarding, rebound, organomegaly or hernia Breasts:  Examined lying and sitting without masses, retractions, discharge or axillary adenopathy. Pelvic:  Ext, BUS, Vagina: With atrophic changes. Mild rectocele noted  Cervix: With atrophic changes. Pap smear done  Uterus: Anteverted, normal size, shape and contour, midline and mobile nontender   Adnexa: Without masses or tenderness    Anus and perineum: Normal   Rectovaginal: Normal sphincter tone without palpated masses or tenderness.    Assessment/Plan:  71 y.o. [id number] female for annual exam .   1. Postmenopausal/atrophic genital changes. No significant hot flushes, night sweats, vaginal dryness or any vaginal bleeding. Report any issues or bleeding. 2. Osteopenia. DEXA 2017 T score right femoral neck FRAX 13%/2.8%. Left femoral neck FRAX 14%/3.3%. I reviewed with her last year increased fracture risk at 3.3% in options for treatment with medication. At that point the patient declined treatment with plans of repeating the DEXA in 2 years which will make it next year. She does have her  vitamin D level checked through her primary physician's office. 3. Mammography 08/2016. Continue with annual mammography when due. Breast exam normal today. 4. Rectocele, mild. Asymptomatic patient. Stable from prior exams. Continue observation with annual exams. 5. Colonoscopy 2015. Repeat at their recommended interval. 6. Pap smear 2015. Pap smear done today. No history of abnormal Pap smears previously. Had slight atypia 1991 with negative colposcopy. Options to stop screening per current screening guidelines reviewed. Will readdress on annual basis. 7. Health maintenance. Blood pressure 144/84 noted to the patient. She is being followed for mild elevated blood pressures by her primary physician and is recording them at home. She'll continue to follow up with them in reference to this. No routine lab work done as patient does as elsewhere. Follow up 1 year, sooner as needed.   [name], 2:44 PM 11/15/2016

## 2016-11-15 NOTE — Addendum Note (Signed)
Addended by: Nelva Nay on: 11/15/2016 03:10 PM   Modules accepted: Orders

## 2016-11-19 LAB — PAP IG W/ RFLX HPV ASCU

## 2016-11-26 DIAGNOSIS — H43812 Vitreous degeneration, left eye: Secondary | ICD-10-CM | POA: Diagnosis not present

## 2016-11-26 DIAGNOSIS — H35341 Macular cyst, hole, or pseudohole, right eye: Secondary | ICD-10-CM | POA: Diagnosis not present

## 2017-05-09 ENCOUNTER — Encounter: Payer: Self-pay | Admitting: Urgent Care

## 2017-05-09 ENCOUNTER — Ambulatory Visit (INDEPENDENT_AMBULATORY_CARE_PROVIDER_SITE_OTHER): Payer: Medicare Other | Admitting: Urgent Care

## 2017-05-09 VITALS — BP 118/86 | HR 84 | Temp 97.8°F | Resp 16 | Ht 68.0 in | Wt 165.0 lb

## 2017-05-09 DIAGNOSIS — S61012A Laceration without foreign body of left thumb without damage to nail, initial encounter: Secondary | ICD-10-CM

## 2017-05-09 DIAGNOSIS — Z23 Encounter for immunization: Secondary | ICD-10-CM

## 2017-05-09 NOTE — Progress Notes (Signed)
   MRN: [id number] DOB: 10-04-1945  Subjective:   Debra Frederick is a 72 y.o. female presenting for left thumb laceration while using a box cutter last night. Patient cleaned her wound very well and has kept it covered using a band aid. Denies fever, drainage of pus or bleeding, red streaks of her hand, swelling. Needs to have her tdap updated.  Debra Frederick has a current medication list which includes the following prescription(s): calcium carb-cholecalciferol, cholecalciferol, fish oil-omega-3 fatty acids, flaxseed (linseed), and vitamin c. Also is allergic to codeine and penicillins.  Debra Frederick  has a past medical history of Bursitis, Cancer ([place]), Cataract, History of shingles (2010), and Osteopenia (07/2015). Also  has a past surgical history that includes Dilation and curettage of uterus; Tubal ligation; Breast lumpectomy; basal cell excised; Colonoscopy; Cataract extraction; and macular eye surg.  Objective:   Vitals: BP (!) 152/87   Pulse 84   Temp 97.8 F (36.6 C) (Oral)   Resp 16   Ht 5\' 8"  (1.727 m)   Wt 165 lb (74.8 kg)   SpO2 96%   BMI 25.09 kg/m   Physical Exam  Constitutional: She is oriented to person, place, and time. She appears well-developed and well-nourished.  Cardiovascular: Normal rate.  Pulmonary/Chest: Effort normal.  Musculoskeletal:       Left hand: She exhibits tenderness (over wounds) and laceration. She exhibits normal range of motion, no bony tenderness, normal capillary refill, no deformity and no swelling. Normal sensation noted. Normal strength noted.       Hands: Neurological: She is alert and oriented to person, place, and time.   Left thumb lacerations repaired using Dermabond.   Assessment and Plan :   Laceration of left thumb without foreign body without damage to nail, initial encounter  Need for Tdap vaccination  Laceration repaired. Wound care reviewed. Return-to-clinic precautions discussed, patient verbalized understanding.   Jaynee Eagles,  PA-C [facility] [phone] 05/09/2017  8:52 AM

## 2017-05-09 NOTE — Patient Instructions (Addendum)
Laceration Care, Adult A laceration is a cut that goes through all of the layers of the skin and into the tissue that is right under the skin. Some lacerations heal on their own. Others need to be closed with stitches (sutures), staples, skin adhesive strips, or skin glue. Proper laceration care minimizes the risk of infection and helps the laceration to heal better. How is this treated? If sutures or staples were used:  Keep the wound clean and dry.  If you were given a bandage (dressing), you should change it at least one time per day or as told by your health care provider. You should also change it if it becomes wet or dirty.  Keep the wound completely dry for the first 24 hours or as told by your health care provider. After that time, you may shower or bathe. However, make sure that the wound is not soaked in water until after the sutures or staples have been removed.  Clean the wound one time each day or as told by your health care provider: ? Wash the wound with soap and water. ? Rinse the wound with water to remove all soap. ? Pat the wound dry with a clean towel. Do not rub the wound.  After cleaning the wound, apply a thin layer of antibiotic ointmentas told by your health care provider. This will help to prevent infection and keep the dressing from sticking to the wound.  Have the sutures or staples removed as told by your health care provider. If skin adhesive strips were used:  Keep the wound clean and dry.  If you were given a bandage (dressing), you should change it at least one time per day or as told by your health care provider. You should also change it if it becomes dirty or wet.  Do not get the skin adhesive strips wet. You may shower or bathe, but be careful to keep the wound dry.  If the wound gets wet, pat it dry with a clean towel. Do not rub the wound.  Skin adhesive strips fall off on their own. You may trim the strips as the wound heals. Do not remove skin  adhesive strips that are still stuck to the wound. They will fall off in time. If skin glue was used:  Try to keep the wound dry, but you may briefly wet it in the shower or bath. Do not soak the wound in water, such as by swimming.  After you have showered or bathed, gently pat the wound dry with a clean towel. Do not rub the wound.  Do not do any activities that will make you sweat heavily until the skin glue has fallen off on its own.  Do not apply liquid, cream, or ointment medicine to the wound while the skin glue is in place. Using those may loosen the film before the wound has healed.  If you were given a bandage (dressing), you should change it at least one time per day or as told by your health care provider. You should also change it if it becomes dirty or wet.  If a dressing is placed over the wound, be careful not to apply tape directly over the skin glue. Doing that may cause the glue to be pulled off before the wound has healed.  Do not pick at the glue. The skin glue usually remains in place for 5-10 days, then it falls off of the skin. General Instructions  Take over-the-counter and prescription medicines only   as told by your health care provider.  If you were prescribed an antibiotic medicine or ointment, take or apply it as told by your doctor. Do not stop using it even if your condition improves.  To help prevent scarring, make sure to cover your wound with sunscreen whenever you are outside after stitches are removed, after adhesive strips are removed, or when glue remains in place and the wound is healed. Make sure to wear a sunscreen of at least 30 SPF.  Do not scratch or pick at the wound.  Keep all follow-up visits as told by your health care provider. This is important.  Check your wound every day for signs of infection. Watch for: ? Redness, swelling, or pain. ? Fluid, blood, or pus.  Raise (elevate) the injured area above the level of your heart while you  are sitting or lying down, if possible. Contact a health care provider if:  You received a tetanus shot and you have swelling, severe pain, redness, or bleeding at the injection site.  You have a fever.  A wound that was closed breaks open.  You notice a bad smell coming from your wound or your dressing.  You notice something coming out of the wound, such as wood or glass.  Your pain is not controlled with medicine.  You have increased redness, swelling, or pain at the site of your wound.  You have fluid, blood, or pus coming from your wound.  You notice a change in the color of your skin near your wound.  You need to change the dressing frequently due to fluid, blood, or pus draining from the wound.  You develop a new rash.  You develop numbness around the wound. Get help right away if:  You develop severe swelling around the wound.  Your pain suddenly increases and is severe.  You develop painful lumps near the wound or on skin that is anywhere on your body.  You have a red streak going away from your wound.  The wound is on your hand or foot and you cannot properly move a finger or toe.  The wound is on your hand or foot and you notice that your fingers or toes look pale or bluish. This information is not intended to replace advice given to you by your health care provider. Make sure you discuss any questions you have with your health care provider. Document Released: 03/18/2005 Document Revised: 08/18/2015 Document Reviewed: 03/14/2014 Elsevier Interactive Patient Education  2018 [employer]     IF you received an x-ray today, you will receive an invoice from [facility]. Please contact [facility] at [phone] with questions or concerns regarding your invoice.   IF you received labwork today, you will receive an invoice from [facility]. Please contact [facility] at [phone] with questions or concerns regarding your invoice.   Our billing  staff will not be able to assist you with questions regarding bills from these companies.  You will be contacted with the lab results as soon as they are available. The fastest way to get your results is to activate your My Chart account. Instructions are located on the last page of this paperwork. If you have not heard from us regarding the results in 2 weeks, please contact this office.      

## 2017-07-30 ENCOUNTER — Telehealth: Payer: Self-pay | Admitting: Family Medicine

## 2017-07-30 NOTE — Telephone Encounter (Signed)
Patient has requested to change her PCP to Dr. Carlota Raspberry - I have changed this in the computer

## 2017-09-02 DIAGNOSIS — H43812 Vitreous degeneration, left eye: Secondary | ICD-10-CM | POA: Diagnosis not present

## 2017-09-02 DIAGNOSIS — H35341 Macular cyst, hole, or pseudohole, right eye: Secondary | ICD-10-CM | POA: Diagnosis not present

## 2017-09-18 DIAGNOSIS — H26491 Other secondary cataract, right eye: Secondary | ICD-10-CM | POA: Diagnosis not present

## 2017-10-08 DIAGNOSIS — L821 Other seborrheic keratosis: Secondary | ICD-10-CM | POA: Diagnosis not present

## 2017-10-08 DIAGNOSIS — L570 Actinic keratosis: Secondary | ICD-10-CM | POA: Diagnosis not present

## 2017-10-08 DIAGNOSIS — D485 Neoplasm of uncertain behavior of skin: Secondary | ICD-10-CM | POA: Diagnosis not present

## 2017-10-08 DIAGNOSIS — Z85828 Personal history of other malignant neoplasm of skin: Secondary | ICD-10-CM | POA: Diagnosis not present

## 2017-10-24 ENCOUNTER — Encounter: Payer: Self-pay | Admitting: Gynecology

## 2017-10-24 DIAGNOSIS — Z1231 Encounter for screening mammogram for malignant neoplasm of breast: Secondary | ICD-10-CM | POA: Diagnosis not present

## 2017-10-24 DIAGNOSIS — Z803 Family history of malignant neoplasm of breast: Secondary | ICD-10-CM | POA: Diagnosis not present

## 2017-10-24 DIAGNOSIS — M8589 Other specified disorders of bone density and structure, multiple sites: Secondary | ICD-10-CM | POA: Diagnosis not present

## 2017-10-30 ENCOUNTER — Encounter: Payer: Self-pay | Admitting: Anesthesiology

## 2017-10-31 ENCOUNTER — Encounter: Payer: Self-pay | Admitting: Gynecology

## 2017-11-01 ENCOUNTER — Other Ambulatory Visit: Payer: Self-pay

## 2017-11-01 ENCOUNTER — Ambulatory Visit (INDEPENDENT_AMBULATORY_CARE_PROVIDER_SITE_OTHER): Payer: Medicare Other | Admitting: Family Medicine

## 2017-11-01 ENCOUNTER — Encounter: Payer: Self-pay | Admitting: Family Medicine

## 2017-11-01 VITALS — BP 156/88 | HR 84 | Temp 97.7°F | Ht 67.5 in | Wt 157.6 lb

## 2017-11-01 DIAGNOSIS — E785 Hyperlipidemia, unspecified: Secondary | ICD-10-CM | POA: Diagnosis not present

## 2017-11-01 DIAGNOSIS — F411 Generalized anxiety disorder: Secondary | ICD-10-CM | POA: Diagnosis not present

## 2017-11-01 DIAGNOSIS — R739 Hyperglycemia, unspecified: Secondary | ICD-10-CM | POA: Diagnosis not present

## 2017-11-01 DIAGNOSIS — R002 Palpitations: Secondary | ICD-10-CM | POA: Diagnosis not present

## 2017-11-01 DIAGNOSIS — R7989 Other specified abnormal findings of blood chemistry: Secondary | ICD-10-CM | POA: Diagnosis not present

## 2017-11-01 DIAGNOSIS — Z0000 Encounter for general adult medical examination without abnormal findings: Secondary | ICD-10-CM

## 2017-11-01 DIAGNOSIS — M8580 Other specified disorders of bone density and structure, unspecified site: Secondary | ICD-10-CM | POA: Diagnosis not present

## 2017-11-01 NOTE — Patient Instructions (Addendum)
See information on stress and anxiety and please follow up in next 4 weeks to discuss these symptoms further. Return to the clinic or go to the nearest emergency room if any of your symptoms worsen or new symptoms occur.  I will check some blood work for the heart palpitations. Please follow up as above to decide next step. Return to the clinic or go to the nearest emergency room if any of your symptoms worsen or new symptoms occur.  Bring your blood pressure machine to next visit to make sure it is getting accurate readings. No new meds for now.  I would recommend Shingrix.  You can have that given at your pharmacy if you choose.   How to Take Your Blood Pressure You can take your blood pressure at home with a machine. You may need to check your blood pressure at home:  To check if you have high blood pressure (hypertension).  To check your blood pressure over time.  To make sure your blood pressure medicine is working.  Supplies needed: You will need a blood pressure machine, or monitor. You can buy one at a drugstore or online. When choosing one:  Choose one with an arm cuff.  Choose one that wraps around your upper arm. Only one finger should fit between your arm and the cuff.  Do not choose one that measures your blood pressure from your wrist or finger.  Your doctor can suggest a monitor. How to prepare Avoid these things for 30 minutes before checking your blood pressure:  Drinking caffeine.  Drinking alcohol.  Eating.  Smoking.  Exercising.  Five minutes before checking your blood pressure:  Pee.  Sit in a dining chair. Avoid sitting in a soft couch or armchair.  Be quiet. Do not talk.  How to take your blood pressure Follow the instructions that came with your machine. If you have a digital blood pressure monitor, these may be the instructions: 1. Sit up straight. 2. Place your feet on the floor. Do not cross your ankles or legs. 3. Rest your left arm at  the level of your heart. You may rest it on a table, desk, or chair. 4. Pull up your shirt sleeve. 5. Wrap the blood pressure cuff around the upper part of your left arm. The cuff should be 1 inch (2.5 cm) above your elbow. It is best to wrap the cuff around bare skin. 6. Fit the cuff snugly around your arm. You should be able to place only one finger between the cuff and your arm. 7. Put the cord inside the groove of your elbow. 8. Press the power button. 9. Sit quietly while the cuff fills with air and loses air. 10. Write down the numbers on the screen. 11. Wait 2-3 minutes and then repeat steps 1-10.  What do the numbers mean? Two numbers make up your blood pressure. The first number is called systolic pressure. The second is called diastolic pressure. An example of a blood pressure reading is "120 over 80" (or 120/80). If you are an adult and do not have a medical condition, use this guide to find out if your blood pressure is normal: Normal  First number: below 120.  Second number: below 80. Elevated  First number: 120-129.  Second number: below 80. Hypertension stage 1  First number: 130-139.  Second number: 80-89. Hypertension stage 2  First number: 140 or above.  Second number: 22 or above. Your blood pressure is above normal even if  only the top or bottom number is above normal. Follow these instructions at home:  Check your blood pressure as often as your doctor tells you to.  Take your monitor to your next doctor's appointment. Your doctor will: ? Make sure you are using it correctly. ? Make sure it is working right.  Make sure you understand what your blood pressure numbers should be.  Tell your doctor if your medicines are causing side effects. Contact a doctor if:  Your blood pressure keeps being high. Get help right away if:  Your first blood pressure number is higher than 180.  Your second blood pressure number is higher than 120. This information  is not intended to replace advice given to you by your health care provider. Make sure you discuss any questions you have with your health care provider. Document Released: 02/29/2008 Document Revised: 02/14/2016 Document Reviewed: 08/25/2015 Elsevier Interactive Patient Education  2018 [employer].   Palpitations A palpitation is the feeling that your heartbeat is irregular or is faster than normal. It may feel like your heart is fluttering or skipping a beat. Palpitations are usually not a serious problem. They may be caused by many things, including smoking, caffeine, alcohol, stress, and certain medicines. Although most causes of palpitations are not serious, palpitations can be a sign of a serious medical problem. In some cases, you may need further medical evaluation. Follow these instructions at home: Pay attention to any changes in your symptoms. Take these actions to help with your condition:  Avoid the following: ? Caffeinated coffee, tea, soft drinks, diet pills, and energy drinks. ? Chocolate. ? Alcohol.  Do not use any tobacco products, such as cigarettes, chewing tobacco, and e-cigarettes. If you need help quitting, ask your health care provider.  Try to reduce your stress and anxiety. Things that can help you relax include: ? Yoga. ? Meditation. ? Physical activity, such as swimming, jogging, or walking. ? Biofeedback. This is a method that helps you learn to use your mind to control things in your body, such as your heartbeats.  Get plenty of rest and sleep.  Take over-the-counter and prescription medicines only as told by your health care provider.  Keep all follow-up visits as told by your health care provider. This is important.  Contact a health care provider if:  You continue to have a fast or irregular heartbeat after 24 hours.  Your palpitations occur more often. Get help right away if:  You have chest pain or shortness of breath.  You have a severe  headache.  You feel dizzy or you faint. This information is not intended to replace advice given to you by your health care provider. Make sure you discuss any questions you have with your health care provider. Document Released: 03/15/2000 Document Revised: 08/21/2015 Document Reviewed: 12/01/2014 Elsevier Interactive Patient Education  2018 [place] 65 Years and Older, Female Preventive care refers to lifestyle choices and visits with your health care provider that can promote health and wellness. What does preventive care include?  A yearly physical exam. This is also called an annual well check.  Dental exams once or twice a year.  Routine eye exams. Ask your health care provider how often you should have your eyes checked.  Personal lifestyle choices, including: ? Daily care of your teeth and gums. ? Regular physical activity. ? Eating a healthy diet. ? Avoiding tobacco and drug use. ? Limiting alcohol use. ? Practicing safe sex. ? Taking  low-dose aspirin every day. ? Taking vitamin and mineral supplements as recommended by your health care provider. What happens during an annual well check? The services and screenings done by your health care provider during your annual well check will depend on your age, overall health, lifestyle risk factors, and family history of disease. Counseling Your health care provider may ask you questions about your:  Alcohol use.  Tobacco use.  Drug use.  Emotional well-being.  Home and relationship well-being.  Sexual activity.  Eating habits.  History of falls.  Memory and ability to understand (cognition).  Work and work Statistician.  Reproductive health.  Screening You may have the following tests or measurements:  Height, weight, and BMI.  Blood pressure.  Lipid and cholesterol levels. These may be checked every 5 years, or more frequently if you are over 67 years old.  Skin check.  Lung  cancer screening. You may have this screening every year starting at age 32 if you have a 30-pack-year history of smoking and currently smoke or have quit within the past 15 years.  Fecal occult blood test (FOBT) of the stool. You may have this test every year starting at age 109.  Flexible sigmoidoscopy or colonoscopy. You may have a sigmoidoscopy every 5 years or a colonoscopy every 10 years starting at age 51.  Hepatitis C blood test.  Hepatitis B blood test.  Sexually transmitted disease (STD) testing.  Diabetes screening. This is done by checking your blood sugar (glucose) after you have not eaten for a while (fasting). You may have this done every 1-3 years.  Bone density scan. This is done to screen for osteoporosis. You may have this done starting at age 83.  Mammogram. This may be done every 1-2 years. Talk to your health care provider about how often you should have regular mammograms.  Talk with your health care provider about your test results, treatment options, and if necessary, the need for more tests. Vaccines Your health care provider may recommend certain vaccines, such as:  Influenza vaccine. This is recommended every year.  Tetanus, diphtheria, and acellular pertussis (Tdap, Td) vaccine. You may need a Td booster every 10 years.  Varicella vaccine. You may need this if you have not been vaccinated.  Zoster vaccine. You may need this after age 26.  Measles, mumps, and rubella (MMR) vaccine. You may need at least one dose of MMR if you were born in 1957 or later. You may also need a second dose.  Pneumococcal 13-valent conjugate (PCV13) vaccine. One dose is recommended after age 105.  Pneumococcal polysaccharide (PPSV23) vaccine. One dose is recommended after age 15.  Meningococcal vaccine. You may need this if you have certain conditions.  Hepatitis A vaccine. You may need this if you have certain conditions or if you travel or work in places where you may be  exposed to hepatitis A.  Hepatitis B vaccine. You may need this if you have certain conditions or if you travel or work in places where you may be exposed to hepatitis B.  Haemophilus influenzae type b (Hib) vaccine. You may need this if you have certain conditions.  Talk to your health care provider about which screenings and vaccines you need and how often you need them. This information is not intended to replace advice given to you by your health care provider. Make sure you discuss any questions you have with your health care provider. Document Released: 04/14/2015 Document Revised: 12/06/2015 Document Reviewed: 01/17/2015 Elsevier Interactive  Patient Education  2018 [place] After being diagnosed with an anxiety disorder, you may be relieved to know why you have felt or behaved a certain way. It is natural to also feel overwhelmed about the treatment ahead and what it will mean for your life. With care and support, you can manage this condition and recover from it. How to cope with anxiety Dealing with stress Stress is your body's reaction to life changes and events, both good and bad. Stress can last just a few hours or it can be ongoing. Stress can play a major role in anxiety, so it is important to learn both how to cope with stress and how to think about it differently. Talk with your health care provider or a counselor to learn more about stress reduction. He or she may suggest some stress reduction techniques, such as:  Music therapy. This can include creating or listening to music that you enjoy and that inspires you.  Mindfulness-based meditation. This involves being aware of your normal breaths, rather than trying to control your breathing. It can be done while sitting or walking.  Centering prayer. This is a kind of meditation that involves focusing on a word, phrase, or sacred image that is meaningful to you and that brings you peace.  Deep  breathing. To do this, expand your stomach and inhale slowly through your nose. Hold your breath for 3-5 seconds. Then exhale slowly, allowing your stomach muscles to relax.  Self-talk. This is a skill where you identify thought patterns that lead to anxiety reactions and correct those thoughts.  Muscle relaxation. This involves tensing muscles then relaxing them.  Choose a stress reduction technique that fits your lifestyle and personality. Stress reduction techniques take time and practice. Set aside 5-15 minutes a day to do them. Therapists can offer training in these techniques. The training may be covered by some insurance plans. Other things you can do to manage stress include:  Keeping a stress diary. This can help you learn what triggers your stress and ways to control your response.  Thinking about how you respond to certain situations. You may not be able to control everything, but you can control your reaction.  Making time for activities that help you relax, and not feeling guilty about spending your time in this way.  Therapy combined with coping and stress-reduction skills provides the best chance for successful treatment. Medicines Medicines can help ease symptoms. Medicines for anxiety include:  Anti-anxiety drugs.  Antidepressants.  Beta-blockers.  Medicines may be used as the main treatment for anxiety disorder, along with therapy, or if other treatments are not working. Medicines should be prescribed by a health care provider. Relationships Relationships can play a big part in helping you recover. Try to spend more time connecting with trusted friends and family members. Consider going to couples counseling, taking family education classes, or going to family therapy. Therapy can help you and others better understand the condition. How to recognize changes in your condition Everyone has a different response to treatment for anxiety. Recovery from anxiety happens when  symptoms decrease and stop interfering with your daily activities at home or work. This may mean that you will start to:  Have better concentration and focus.  Sleep better.  Be less irritable.  Have more energy.  Have improved memory.  It is important to recognize when your condition is getting worse. Contact your health care provider if your symptoms interfere with home or work  and you do not feel like your condition is improving. Where to find help and support: You can get help and support from these sources:  Self-help groups.  Online and [employer].  A trusted spiritual leader.  Couples counseling.  Family education classes.  Family therapy.  Follow these instructions at home:  Eat a healthy diet that includes plenty of vegetables, fruits, whole grains, low-fat dairy products, and lean protein. Do not eat a lot of foods that are high in solid fats, added sugars, or salt.  Exercise. Most adults should do the following: ? Exercise for at least 150 minutes each week. The exercise should increase your heart rate and make you sweat (moderate-intensity exercise). ? Strengthening exercises at least twice a week.  Cut down on caffeine, tobacco, alcohol, and other potentially harmful substances.  Get the right amount and quality of sleep. Most adults need 7-9 hours of sleep each night.  Make choices that simplify your life.  Take over-the-counter and prescription medicines only as told by your health care provider.  Avoid caffeine, alcohol, and certain over-the-counter cold medicines. These may make you feel worse. Ask your pharmacist which medicines to avoid.  Keep all follow-up visits as told by your health care provider. This is important. Questions to ask your health care provider  Would I benefit from therapy?  How often should I follow up with a health care provider?  How long do I need to take medicine?  Are there any long-term side effects of my  medicine?  Are there any alternatives to taking medicine? Contact a health care provider if:  You have a hard time staying focused or finishing daily tasks.  You spend many hours a day feeling worried about everyday life.  You become exhausted by worry.  You start to have headaches, feel tense, or have nausea.  You urinate more than normal.  You have diarrhea. Get help right away if:  You have a racing heart and shortness of breath.  You have thoughts of hurting yourself or others. If you ever feel like you may hurt yourself or others, or have thoughts about taking your own life, get help right away. You can go to your nearest emergency department or call:  Your local emergency services (911 in the [place]  A suicide crisis helpline, such as the [facility] at (905)347-7029. This is open 24-hours a day.  Summary  Taking steps to deal with stress can help calm you.  Medicines cannot cure anxiety disorders, but they can help ease symptoms.  Family, friends, and partners can play a big part in helping you recover from an anxiety disorder. This information is not intended to replace advice given to you by your health care provider. Make sure you discuss any questions you have with your health care provider. Document Released: 03/12/2016 Document Revised: 03/12/2016 Document Reviewed: 03/12/2016 Elsevier Interactive Patient Education  2018 [place] and Stress Management Stress is a normal reaction to life events. It is what you feel when life demands more than you are used to or more than you can handle. Some stress can be useful. For example, the stress reaction can help you catch the last bus of the day, study for a test, or meet a deadline at work. But stress that occurs too often or for too long can cause problems. It can affect your emotional health and interfere with relationships and normal daily activities. Too much stress can weaken  your immune system  and increase your risk for physical illness. If you already have a medical problem, stress can make it worse. What are the causes? All sorts of life events may cause stress. An event that causes stress for one person may not be stressful for another person. Major life events commonly cause stress. These may be positive or negative. Examples include losing your job, moving into a new home, getting married, having a baby, or losing a loved one. Less obvious life events may also cause stress, especially if they occur day after day or in combination. Examples include working long hours, driving in traffic, caring for children, being in debt, or being in a difficult relationship. What are the signs or symptoms? Stress may cause emotional symptoms including, the following:  Anxiety. This is feeling worried, afraid, on edge, overwhelmed, or out of control.  Anger. This is feeling irritated or impatient.  Depression. This is feeling sad, down, helpless, or guilty.  Difficulty focusing, remembering, or making decisions.  Stress may cause physical symptoms, including the following:  Aches and pains. These may affect your head, neck, back, stomach, or other areas of your body.  Tight muscles or clenched jaw.  Low energy or trouble sleeping.  Stress may cause unhealthy behaviors, including the following:  Eating to feel better (overeating) or skipping meals.  Sleeping too little, too much, or both.  Working too much or putting off tasks (procrastination).  Smoking, drinking alcohol, or using drugs to feel better.  How is this diagnosed? Stress is diagnosed through an assessment by your health care provider. Your health care provider will ask questions about your symptoms and any stressful life events.Your health care provider will also ask about your medical history and may order blood tests or other tests. Certain medical conditions and medicine can cause physical symptoms  similar to stress. Mental illness can cause emotional symptoms and unhealthy behaviors similar to stress. Your health care provider may refer you to a mental health professional for further evaluation. How is this treated? Stress management is the recommended treatment for stress.The goals of stress management are reducing stressful life events and coping with stress in healthy ways. Techniques for reducing stressful life events include the following:  Stress identification. Self-monitor for stress and identify what causes stress for you. These skills may help you to avoid some stressful events.  Time management. Set your priorities, keep a calendar of events, and learn to say "no." These tools can help you avoid making too many commitments.  Techniques for coping with stress include the following:  Rethinking the problem. Try to think realistically about stressful events rather than ignoring them or overreacting. Try to find the positives in a stressful situation rather than focusing on the negatives.  Exercise. Physical exercise can release both physical and emotional tension. The key is to find a form of exercise you enjoy and do it regularly.  Relaxation techniques. These relax the body and mind. Examples include yoga, meditation, tai chi, biofeedback, deep breathing, progressive muscle relaxation, listening to music, being out in nature, journaling, and other hobbies. Again, the key is to find one or more that you enjoy and can do regularly.  Healthy lifestyle. Eat a balanced diet, get plenty of sleep, and do not smoke. Avoid using alcohol or drugs to relax.  Strong support network. Spend time with family, friends, or other people you enjoy being around.Express your feelings and talk things over with someone you trust.  Counseling or talktherapy with a mental health professional  may be helpful if you are having difficulty managing stress on your own. Medicine is typically not recommended  for the treatment of stress.Talk to your health care provider if you think you need medicine for symptoms of stress. Follow these instructions at home:  Keep all follow-up visits as directed by your health care provider.  Take all medicines as directed by your health care provider. Contact a health care provider if:  Your symptoms get worse or you start having new symptoms.  You feel overwhelmed by your problems and can no longer manage them on your own. Get help right away if:  You feel like hurting yourself or someone else. This information is not intended to replace advice given to you by your health care provider. Make sure you discuss any questions you have with your health care provider. Document Released: 09/11/2000 Document Revised: 08/24/2015 Document Reviewed: 11/10/2012 Elsevier Interactive Patient Education  2017 [employer].       IF you received an x-ray today, you will receive an invoice from [facility]. Please contact [facility] at [phone] with questions or concerns regarding your invoice.   IF you received labwork today, you will receive an invoice from [facility]. Please contact [facility] at [phone] with questions or concerns regarding your invoice.   Our billing staff will not be able to assist you with questions regarding bills from these companies.  You will be contacted with the lab results as soon as they are available. The fastest way to get your results is to activate your My Chart account. Instructions are located on the last page of this paperwork. If you have not heard from [place] regarding the results in 2 weeks, please contact this office.

## 2017-11-01 NOTE — Progress Notes (Signed)
Subjective:    Patient ID: Debra Frederick, female    DOB: 02/15/1946, 72 y.o.   MRN: [id number]  HPI Debra Frederick is a 72 y.o. female Presents today for: Chief Complaint  Patient presents with  . Annual Exam    CPE   New patient to me, here for a wellness exam/physical today.  On review of her chart she has had history of shingles, osteopenia with vitamin D deficiency.  Last Medicare wellness exam October 24, 2016.    Elevated blood pressure: blood pressure elevated prior visits, thought to have whitecoat syndrome.  Home blood pressures 110-130/70-80 noted at last Medicare wellness visit.  As well with range of 107/71 to a high of 944/96 Systolics never above 759, diastolics primarily 16B to 70s. Even when stressed, BP has been low.   Osteopenia: Exercises daily with yoga 3 times per week.  Has had history of vitamin D deficiency, takes 5000 units of vitamin D per day, calcium through the diet.  Vitamin D level of 44 last year. Last bone density on 10/30/2017, T score -2.2 at left femoral neck  Heart palpitations: Noted more frequent past 1 year.  Not specifically associated with certain activity, has been exercising as usual and not noticed. No associated chest pain, just an extra beat or missed beat. Some days 5-6 times, others none. No apparent triggers. No associated symptoms.  Saw cardiologist years ago wothout concerns. Minimal caffeine, no recent changes. Some anxiety. Has been having some dental issues, stress with her children.  Has always had some anxiety, more recently.  No recent meds (years ago med caused too much sedation) and no counseling prior. Anxiety in family as well. Treats stress with walking, shopping. Part time job is enjoyable, no stress.  looks forward to it. Denies depression.  Hyperlipidemia: Lab Results  Component Value Date   CHOL 236 (H) 10/24/2016   HDL 75 10/24/2016   LDLCALC 137 (H) 10/24/2016   TRIG 119 10/24/2016   CHOLHDL 3.1 10/24/2016  LDL had  increased from 111 2 years ago to 137 last year.  Recommended diet/exercise as initial approach.  Wt Readings from Last 3 Encounters:  11/01/17 157 lb 9.6 oz (71.5 kg)  05/09/17 165 lb (74.8 kg)  11/15/16 164 lb (74.4 kg)   Hyperglycemia: Glucose 103 last year, up from 98 when checked 2 years ago.  Cancer screening: Colonoscopy 07/06/2013, repeat 10 years Dr. Olevia Perches Mammogram 09/24/2016, had repeat last Friday - told was ok.  Pap testing 11/15/2016, Dr. Phineas Real  Immunizations: Immunization History  Administered Date(s) Administered  . Influenza Split 12/29/2015  . Influenza Whole 12/18/2011  . Influenza, High Dose Seasonal PF 12/24/2016  . Influenza-Unspecified 12/30/2012, 12/20/2013, 12/29/2014  . Pneumococcal Conjugate-13 10/13/2014  . Pneumococcal Polysaccharide-23 10/02/2010  . Tdap 10/13/2007, 05/09/2017  . Zoster 04/01/2010  not sure if she wants to have new shingles vaccine.   Fall screening: No falls in the past year  Depression screening: Depression screen [facility] 2/9 11/01/2017 05/09/2017 10/24/2016 08/01/2016 10/19/2015  Decreased Interest 0 0 0 0 0  Down, Depressed, Hopeless 1 0 0 0 0  PHQ - 2 Score 1 0 0 0 0   Functional status screening: Functional Status Survey: Is the patient deaf or have difficulty hearing?: No Does the patient have difficulty seeing, even when wearing glasses/contacts?: No Does the patient have difficulty concentrating, remembering, or making decisions?: No Does the patient have difficulty walking or climbing stairs?: No Does the patient have difficulty dressing or  bathing?: No Does the patient have difficulty doing errands alone such as visiting a doctor's office or shopping?: No  Memory screen: 6CIT Screen 11/01/2017  What Year? 0 points  What month? 0 points  What time? 0 points  Count back from 20 0 points  Months in reverse 0 points  Repeat phrase 6 points  Total Score 6   Vision:  Visual Acuity Screening   Right eye Left eye Both eyes    Without correction:     With correction: 20/70 20/25-1 20/40  optho annually, retina specialist 6 months.   Dentist: every 6 months. Has had some issues needing repair.   Exercise: As above, exercises most days per week. Yoga 3-4 times per week.  Part of treatment for her osteopenia.  Advanced directives: She has a living well, copies requested    Patient Active Problem List   Diagnosis Date Noted  . Hematuria 10/13/2014  . Bursitis   . History of shingles   . Osteopenia   . Vitamin D deficiency    Past Medical History:  Diagnosis Date  . Bursitis   . Cancer ([place])    basal cell CA x2- thigh and left chest-90's  . Cataract   . History of shingles 2010  . Osteopenia 10/2017   T score -2.2 FRAX 14% / 3.6%   Past Surgical History:  Procedure Laterality Date  . basal cell excised    . BREAST LUMPECTOMY     Benign  . CATARACT EXTRACTION    . COLONOSCOPY    . DILATION AND CURETTAGE OF UTERUS    . macular eye surg    . TUBAL LIGATION     Allergies  Allergen Reactions  . Codeine Other (See Comments)    Jittery  . Penicillins Rash   Prior to Admission medications   Medication Sig Start Date End Date Taking? Authorizing Provider  Calcium Carb-Cholecalciferol (CALCIUM 1000 + D PO) Take by mouth.   Yes [provider]  Cholecalciferol (VITAMIN D PO) Take by mouth.   Yes [provider]  fish oil-omega-3 fatty acids 1000 MG capsule Take 1 g by mouth daily. Reported on 09/22/2015   Yes [provider]  Flaxseed, Linseed, (FLAX SEED OIL PO) Take by mouth.   Yes [provider]  vitamin C (ASCORBIC ACID) 500 MG tablet Take 500 mg by mouth daily.   Yes [provider]   Social History   Socioeconomic History  . Marital status: Married    Spouse name: Not on file  . Number of children: Not on file  . Years of education: Not on file  . Highest education level: Not on file  Occupational History  . Not on file  Social Needs   . Financial resource strain: Not on file  . Food insecurity:    Worry: Not on file    Inability: Not on file  . Transportation needs:    Medical: Not on file    Non-medical: Not on file  Tobacco Use  . Smoking status: Never Smoker  . Smokeless tobacco: Never Used  Substance and Sexual Activity  . Alcohol use: Yes    Alcohol/week: 0.0 oz    Comment: Rare  . Drug use: No  . Sexual activity: Yes    Birth control/protection: Surgical, Post-menopausal    Comment: BTL-1st intercourse 3 yo-1 partner  Lifestyle  . Physical activity:    Days per week: Not on file    Minutes per session: Not on file  .  Stress: Not on file  Relationships  . Social connections:    Talks on phone: Not on file    Gets together: Not on file    Attends religious service: Not on file    Active member of club or organization: Not on file    Attends meetings of clubs or organizations: Not on file    Relationship status: Not on file  . Intimate partner violence:    Fear of current or ex partner: Not on file    Emotionally abused: Not on file    Physically abused: Not on file    Forced sexual activity: Not on file  Other Topics Concern  . Not on file  Social History Narrative  . Not on file    Review of Systems  HENT: Positive for tinnitus (chronic, no changes. ).   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 ).  Cardiovascular: Positive for palpitations.  Musculoskeletal: Positive for back pain (scoliosis. no acute changes. ).  Neurological: Positive for facial asymmetry.  Psychiatric/Behavioral: The patient is nervous/anxious (as above. ).     13 point review of systems per patient health survey noted.  Negative other than as indicated.  She brings home readings today     Objective:   Physical Exam  Constitutional: She is oriented to person, place, and time. She appears well-developed and well-nourished.  HENT:  Head: Normocephalic and  atraumatic.  Right Ear: External ear normal.  Left Ear: External ear normal.  Mouth/Throat: Oropharynx is clear and moist.  Eyes: Pupils are equal, round, and reactive to light. Conjunctivae are normal.  Neck: Normal range of motion. Neck supple. No thyromegaly present.  Cardiovascular: Normal rate, regular rhythm, normal heart sounds and intact distal pulses.  No murmur heard. Pulmonary/Chest: Effort normal and breath sounds normal. No respiratory distress. She has no wheezes.  Abdominal: Soft. Bowel sounds are normal. There is no tenderness.  Musculoskeletal: Normal range of motion. She exhibits no edema or tenderness.  Lymphadenopathy:    She has no cervical adenopathy.  Neurological: She is alert and oriented to person, place, and time.  Skin: Skin is warm and dry. No rash noted.  Psychiatric: She has a normal mood and affect. Her behavior is normal. Thought content normal.   Vitals:   11/01/17 0829 11/01/17 0836  BP: (!) 162/92 (!) 156/88  Pulse: 84   Temp: 97.7 F (36.5 C)   TempSrc: Oral   SpO2: 95%   Weight: 157 lb 9.6 oz (71.5 kg)   Height: 5' 7.5" (1.715 m)    EKG: Sinus bradycardia rate 58.  No apparent acute findings.      Assessment & Plan:   Debra Frederick is a 72 y.o. female Palpitations - Plan: TSH, CBC, EKG 12-Lead  -Have been present for some time, but noticed some increased frequency recently.  Does note some increased anxiety/stress, which may be trigger.  No associated chest pain, dizziness or other red flag symptoms.  No concerning findings on EKG in office.  Start with TSH, CBC, handout given on stress and anxiety, and recheck in 4 weeks to determine if cardiology visit needed.  RTC/ER precautions of acute worsening  Medicare annual wellness visit, subsequent  - anticipatory guidance as below in AVS, screening labs if needed. Health maintenance items as above in HPI discussed/recommended as applicable.   - no concerning responses on depression, fall, or  functional status screening. Any positive responses noted as above. Advanced directives  discussed as in [facility].   -Shingrix vaccine recommended, she is still deciding and can have that done at her pharmacy  Anxiety state Possible long-standing anxiety with situational stressors and worsening.  Denies depression symptoms. -Decided to hold on meds for now, handout given for stress management and anxiety management, continue exercise and recheck in the next 4 weeks  Osteopenia, unspecified location - Plan: Vitamin D, 25-hydroxy Low vitamin D level - Plan: Vitamin D, 25-hydroxy  -Recent bone density noted.  Continue calcium through diet, exercise with weightbearing exercise as possible, check vitamin D level as low previously, continue same dose for now over-the-counter.  Hyperglycemia - Plan: Comprehensive metabolic panel  -Mild previously, check CMP, consider A1c if elevated.  Continue exercise  Hyperlipidemia, unspecified hyperlipidemia type - Plan: Lipid panel  Slight elevation previously, repeat testing to determine ASCVD risk and statin recommendations.-  No orders of the defined types were placed in this encounter.  Patient Instructions   See information on stress and anxiety and please follow up in next 4 weeks to discuss these symptoms further. Return to the clinic or go to the nearest emergency room if any of your symptoms worsen or new symptoms occur.  I will check some blood work for the heart palpitations. Please follow up as above to decide next step. Return to the clinic or go to the nearest emergency room if any of your symptoms worsen or new symptoms occur.  Bring your blood pressure machine to next visit to make sure it is getting accurate readings. No new meds for now.  I would recommend Shingrix.  You can have that given at your pharmacy if you choose.   How to Take Your Blood Pressure You can take your blood pressure at home with a machine. You may need to check your blood  pressure at home:  To check if you have high blood pressure (hypertension).  To check your blood pressure over time.  To make sure your blood pressure medicine is working.  Supplies needed: You will need a blood pressure machine, or monitor. You can buy one at a drugstore or online. When choosing one:  Choose one with an arm cuff.  Choose one that wraps around your upper arm. Only one finger should fit between your arm and the cuff.  Do not choose one that measures your blood pressure from your wrist or finger.  Your doctor can suggest a monitor. How to prepare Avoid these things for 30 minutes before checking your blood pressure:  Drinking caffeine.  Drinking alcohol.  Eating.  Smoking.  Exercising.  Five minutes before checking your blood pressure:  Pee.  Sit in a dining chair. Avoid sitting in a soft couch or armchair.  Be quiet. Do not talk.  How to take your blood pressure Follow the instructions that came with your machine. If you have a digital blood pressure monitor, these may be the instructions: 1. Sit up straight. 2. Place your feet on the floor. Do not cross your ankles or legs. 3. Rest your left arm at the level of your heart. You may rest it on a table, desk, or chair. 4. Pull up your shirt sleeve. 5. Wrap the blood pressure cuff around the upper part of your left arm. The cuff should be 1 inch (2.5 cm) above your elbow. It is best to wrap the cuff around bare skin. 6. Fit the cuff snugly around your arm. You should be able to place only one finger between the cuff  and your arm. 7. Put the cord inside the groove of your elbow. 8. Press the power button. 9. Sit quietly while the cuff fills with air and loses air. 10. Write down the numbers on the screen. 11. Wait 2-3 minutes and then repeat steps 1-10.  What do the numbers mean? Two numbers make up your blood pressure. The first number is called systolic pressure. The second is called diastolic  pressure. An example of a blood pressure reading is "120 over 80" (or 120/80). If you are an adult and do not have a medical condition, use this guide to find out if your blood pressure is normal: Normal  First number: below 120.  Second number: below 80. Elevated  First number: 120-129.  Second number: below 80. Hypertension stage 1  First number: 130-139.  Second number: 80-89. Hypertension stage 2  First number: 140 or above.  Second number: 73 or above. Your blood pressure is above normal even if only the top or bottom number is above normal. Follow these instructions at home:  Check your blood pressure as often as your doctor tells you to.  Take your monitor to your next doctor's appointment. Your doctor will: ? Make sure you are using it correctly. ? Make sure it is working right.  Make sure you understand what your blood pressure numbers should be.  Tell your doctor if your medicines are causing side effects. Contact a doctor if:  Your blood pressure keeps being high. Get help right away if:  Your first blood pressure number is higher than 180.  Your second blood pressure number is higher than 120. This information is not intended to replace advice given to you by your health care provider. Make sure you discuss any questions you have with your health care provider. Document Released: 02/29/2008 Document Revised: 02/14/2016 Document Reviewed: 08/25/2015 Elsevier Interactive Patient Education  2018 [employer].   Palpitations A palpitation is the feeling that your heartbeat is irregular or is faster than normal. It may feel like your heart is fluttering or skipping a beat. Palpitations are usually not a serious problem. They may be caused by many things, including smoking, caffeine, alcohol, stress, and certain medicines. Although most causes of palpitations are not serious, palpitations can be a sign of a serious medical problem. In some cases, you may need  further medical evaluation. Follow these instructions at home: Pay attention to any changes in your symptoms. Take these actions to help with your condition:  Avoid the following: ? Caffeinated coffee, tea, soft drinks, diet pills, and energy drinks. ? Chocolate. ? Alcohol.  Do not use any tobacco products, such as cigarettes, chewing tobacco, and e-cigarettes. If you need help quitting, ask your health care provider.  Try to reduce your stress and anxiety. Things that can help you relax include: ? Yoga. ? Meditation. ? Physical activity, such as swimming, jogging, or walking. ? Biofeedback. This is a method that helps you learn to use your mind to control things in your body, such as your heartbeats.  Get plenty of rest and sleep.  Take over-the-counter and prescription medicines only as told by your health care provider.  Keep all follow-up visits as told by your health care provider. This is important.  Contact a health care provider if:  You continue to have a fast or irregular heartbeat after 24 hours.  Your palpitations occur more often. Get help right away if:  You have chest pain or shortness of breath.  You have  a severe headache.  You feel dizzy or you faint. This information is not intended to replace advice given to you by your health care provider. Make sure you discuss any questions you have with your health care provider. Document Released: 03/15/2000 Document Revised: 08/21/2015 Document Reviewed: 12/01/2014 Elsevier Interactive Patient Education  2018 [place] 65 Years and Older, Female Preventive care refers to lifestyle choices and visits with your health care provider that can promote health and wellness. What does preventive care include?  A yearly physical exam. This is also called an annual well check.  Dental exams once or twice a year.  Routine eye exams. Ask your health care provider how often you should have your eyes  checked.  Personal lifestyle choices, including: ? Daily care of your teeth and gums. ? Regular physical activity. ? Eating a healthy diet. ? Avoiding tobacco and drug use. ? Limiting alcohol use. ? Practicing safe sex. ? Taking low-dose aspirin every day. ? Taking vitamin and mineral supplements as recommended by your health care provider. What happens during an annual well check? The services and screenings done by your health care provider during your annual well check will depend on your age, overall health, lifestyle risk factors, and family history of disease. Counseling Your health care provider may ask you questions about your:  Alcohol use.  Tobacco use.  Drug use.  Emotional well-being.  Home and relationship well-being.  Sexual activity.  Eating habits.  History of falls.  Memory and ability to understand (cognition).  Work and work Statistician.  Reproductive health.  Screening You may have the following tests or measurements:  Height, weight, and BMI.  Blood pressure.  Lipid and cholesterol levels. These may be checked every 5 years, or more frequently if you are over 9 years old.  Skin check.  Lung cancer screening. You may have this screening every year starting at age 23 if you have a 30-pack-year history of smoking and currently smoke or have quit within the past 15 years.  Fecal occult blood test (FOBT) of the stool. You may have this test every year starting at age 41.  Flexible sigmoidoscopy or colonoscopy. You may have a sigmoidoscopy every 5 years or a colonoscopy every 10 years starting at age 33.  Hepatitis C blood test.  Hepatitis B blood test.  Sexually transmitted disease (STD) testing.  Diabetes screening. This is done by checking your blood sugar (glucose) after you have not eaten for a while (fasting). You may have this done every 1-3 years.  Bone density scan. This is done to screen for osteoporosis. You may have this done  starting at age 38.  Mammogram. This may be done every 1-2 years. Talk to your health care provider about how often you should have regular mammograms.  Talk with your health care provider about your test results, treatment options, and if necessary, the need for more tests. Vaccines Your health care provider may recommend certain vaccines, such as:  Influenza vaccine. This is recommended every year.  Tetanus, diphtheria, and acellular pertussis (Tdap, Td) vaccine. You may need a Td booster every 10 years.  Varicella vaccine. You may need this if you have not been vaccinated.  Zoster vaccine. You may need this after age 46.  Measles, mumps, and rubella (MMR) vaccine. You may need at least one dose of MMR if you were born in 1957 or later. You may also need a second dose.  Pneumococcal 13-valent conjugate (PCV13) vaccine.  One dose is recommended after age 71.  Pneumococcal polysaccharide (PPSV23) vaccine. One dose is recommended after age 11.  Meningococcal vaccine. You may need this if you have certain conditions.  Hepatitis A vaccine. You may need this if you have certain conditions or if you travel or work in places where you may be exposed to hepatitis A.  Hepatitis B vaccine. You may need this if you have certain conditions or if you travel or work in places where you may be exposed to hepatitis B.  Haemophilus influenzae type b (Hib) vaccine. You may need this if you have certain conditions.  Talk to your health care provider about which screenings and vaccines you need and how often you need them. This information is not intended to replace advice given to you by your health care provider. Make sure you discuss any questions you have with your health care provider. Document Released: 04/14/2015 Document Revised: 12/06/2015 Document Reviewed: 01/17/2015 Elsevier Interactive Patient Education  2018 [place] After being diagnosed with an anxiety  disorder, you may be relieved to know why you have felt or behaved a certain way. It is natural to also feel overwhelmed about the treatment ahead and what it will mean for your life. With care and support, you can manage this condition and recover from it. How to cope with anxiety Dealing with stress Stress is your body's reaction to life changes and events, both good and bad. Stress can last just a few hours or it can be ongoing. Stress can play a major role in anxiety, so it is important to learn both how to cope with stress and how to think about it differently. Talk with your health care provider or a counselor to learn more about stress reduction. He or she may suggest some stress reduction techniques, such as:  Music therapy. This can include creating or listening to music that you enjoy and that inspires you.  Mindfulness-based meditation. This involves being aware of your normal breaths, rather than trying to control your breathing. It can be done while sitting or walking.  Centering prayer. This is a kind of meditation that involves focusing on a word, phrase, or sacred image that is meaningful to you and that brings you peace.  Deep breathing. To do this, expand your stomach and inhale slowly through your nose. Hold your breath for 3-5 seconds. Then exhale slowly, allowing your stomach muscles to relax.  Self-talk. This is a skill where you identify thought patterns that lead to anxiety reactions and correct those thoughts.  Muscle relaxation. This involves tensing muscles then relaxing them.  Choose a stress reduction technique that fits your lifestyle and personality. Stress reduction techniques take time and practice. Set aside 5-15 minutes a day to do them. Therapists can offer training in these techniques. The training may be covered by some insurance plans. Other things you can do to manage stress include:  Keeping a stress diary. This can help you learn what triggers your stress  and ways to control your response.  Thinking about how you respond to certain situations. You may not be able to control everything, but you can control your reaction.  Making time for activities that help you relax, and not feeling guilty about spending your time in this way.  Therapy combined with coping and stress-reduction skills provides the best chance for successful treatment. Medicines Medicines can help ease symptoms. Medicines for anxiety include:  Anti-anxiety drugs.  Antidepressants.  Beta-blockers.  Medicines may be used as the main treatment for anxiety disorder, along with therapy, or if other treatments are not working. Medicines should be prescribed by a health care provider. Relationships Relationships can play a big part in helping you recover. Try to spend more time connecting with trusted friends and family members. Consider going to couples counseling, taking family education classes, or going to family therapy. Therapy can help you and others better understand the condition. How to recognize changes in your condition Everyone has a different response to treatment for anxiety. Recovery from anxiety happens when symptoms decrease and stop interfering with your daily activities at home or work. This may mean that you will start to:  Have better concentration and focus.  Sleep better.  Be less irritable.  Have more energy.  Have improved memory.  It is important to recognize when your condition is getting worse. Contact your health care provider if your symptoms interfere with home or work and you do not feel like your condition is improving. Where to find help and support: You can get help and support from these sources:  Self-help groups.  Online and [employer].  A trusted spiritual leader.  Couples counseling.  Family education classes.  Family therapy.  Follow these instructions at home:  Eat a healthy diet that includes plenty of  vegetables, fruits, whole grains, low-fat dairy products, and lean protein. Do not eat a lot of foods that are high in solid fats, added sugars, or salt.  Exercise. Most adults should do the following: ? Exercise for at least 150 minutes each week. The exercise should increase your heart rate and make you sweat (moderate-intensity exercise). ? Strengthening exercises at least twice a week.  Cut down on caffeine, tobacco, alcohol, and other potentially harmful substances.  Get the right amount and quality of sleep. Most adults need 7-9 hours of sleep each night.  Make choices that simplify your life.  Take over-the-counter and prescription medicines only as told by your health care provider.  Avoid caffeine, alcohol, and certain over-the-counter cold medicines. These may make you feel worse. Ask your pharmacist which medicines to avoid.  Keep all follow-up visits as told by your health care provider. This is important. Questions to ask your health care provider  Would I benefit from therapy?  How often should I follow up with a health care provider?  How long do I need to take medicine?  Are there any long-term side effects of my medicine?  Are there any alternatives to taking medicine? Contact a health care provider if:  You have a hard time staying focused or finishing daily tasks.  You spend many hours a day feeling worried about everyday life.  You become exhausted by worry.  You start to have headaches, feel tense, or have nausea.  You urinate more than normal.  You have diarrhea. Get help right away if:  You have a racing heart and shortness of breath.  You have thoughts of hurting yourself or others. If you ever feel like you may hurt yourself or others, or have thoughts about taking your own life, get help right away. You can go to your nearest emergency department or call:  Your local emergency services (911 in the [place]  A suicide crisis helpline, such as  the [facility] at (281) 017-8274. This is open 24-hours a day.  Summary  Taking steps to deal with stress can help calm you.  Medicines cannot cure anxiety disorders, but they can  help ease symptoms.  Family, friends, and partners can play a big part in helping you recover from an anxiety disorder. This information is not intended to replace advice given to you by your health care provider. Make sure you discuss any questions you have with your health care provider. Document Released: 03/12/2016 Document Revised: 03/12/2016 Document Reviewed: 03/12/2016 Elsevier Interactive Patient Education  2018 [place] and Stress Management Stress is a normal reaction to life events. It is what you feel when life demands more than you are used to or more than you can handle. Some stress can be useful. For example, the stress reaction can help you catch the last bus of the day, study for a test, or meet a deadline at work. But stress that occurs too often or for too long can cause problems. It can affect your emotional health and interfere with relationships and normal daily activities. Too much stress can weaken your immune system and increase your risk for physical illness. If you already have a medical problem, stress can make it worse. What are the causes? All sorts of life events may cause stress. An event that causes stress for one person may not be stressful for another person. Major life events commonly cause stress. These may be positive or negative. Examples include losing your job, moving into a new home, getting married, having a baby, or losing a loved one. Less obvious life events may also cause stress, especially if they occur day after day or in combination. Examples include working long hours, driving in traffic, caring for children, being in debt, or being in a difficult relationship. What are the signs or symptoms? Stress may cause emotional symptoms  including, the following:  Anxiety. This is feeling worried, afraid, on edge, overwhelmed, or out of control.  Anger. This is feeling irritated or impatient.  Depression. This is feeling sad, down, helpless, or guilty.  Difficulty focusing, remembering, or making decisions.  Stress may cause physical symptoms, including the following:  Aches and pains. These may affect your head, neck, back, stomach, or other areas of your body.  Tight muscles or clenched jaw.  Low energy or trouble sleeping.  Stress may cause unhealthy behaviors, including the following:  Eating to feel better (overeating) or skipping meals.  Sleeping too little, too much, or both.  Working too much or putting off tasks (procrastination).  Smoking, drinking alcohol, or using drugs to feel better.  How is this diagnosed? Stress is diagnosed through an assessment by your health care provider. Your health care provider will ask questions about your symptoms and any stressful life events.Your health care provider will also ask about your medical history and may order blood tests or other tests. Certain medical conditions and medicine can cause physical symptoms similar to stress. Mental illness can cause emotional symptoms and unhealthy behaviors similar to stress. Your health care provider may refer you to a mental health professional for further evaluation. How is this treated? Stress management is the recommended treatment for stress.The goals of stress management are reducing stressful life events and coping with stress in healthy ways. Techniques for reducing stressful life events include the following:  Stress identification. Self-monitor for stress and identify what causes stress for you. These skills may help you to avoid some stressful events.  Time management. Set your priorities, keep a calendar of events, and learn to say "no." These tools can help you avoid making too many commitments.  Techniques  for coping with stress  include the following:  Rethinking the problem. Try to think realistically about stressful events rather than ignoring them or overreacting. Try to find the positives in a stressful situation rather than focusing on the negatives.  Exercise. Physical exercise can release both physical and emotional tension. The key is to find a form of exercise you enjoy and do it regularly.  Relaxation techniques. These relax the body and mind. Examples include yoga, meditation, tai chi, biofeedback, deep breathing, progressive muscle relaxation, listening to music, being out in nature, journaling, and other hobbies. Again, the key is to find one or more that you enjoy and can do regularly.  Healthy lifestyle. Eat a balanced diet, get plenty of sleep, and do not smoke. Avoid using alcohol or drugs to relax.  Strong support network. Spend time with family, friends, or other people you enjoy being around.Express your feelings and talk things over with someone you trust.  Counseling or talktherapy with a mental health professional may be helpful if you are having difficulty managing stress on your own. Medicine is typically not recommended for the treatment of stress.Talk to your health care provider if you think you need medicine for symptoms of stress. Follow these instructions at home:  Keep all follow-up visits as directed by your health care provider.  Take all medicines as directed by your health care provider. Contact a health care provider if:  Your symptoms get worse or you start having new symptoms.  You feel overwhelmed by your problems and can no longer manage them on your own. Get help right away if:  You feel like hurting yourself or someone else. This information is not intended to replace advice given to you by your health care provider. Make sure you discuss any questions you have with your health care provider. Document Released: 09/11/2000 Document Revised:  08/24/2015 Document Reviewed: 11/10/2012 Elsevier Interactive Patient Education  2017 [employer].       IF you received an x-ray today, you will receive an invoice from [facility]. Please contact [facility] at [phone] with questions or concerns regarding your invoice.   IF you received labwork today, you will receive an invoice from [facility]. Please contact [facility] at [phone] with questions or concerns regarding your invoice.   Our billing staff will not be able to assist you with questions regarding bills from these companies.  You will be contacted with the lab results as soon as they are available. The fastest way to get your results is to activate your My Chart account. Instructions are located on the last page of this paperwork. If you have not heard from [place] regarding the results in 2 weeks, please contact this office.       Signed,   Merri Ray, MD [facility].  11/01/17 10:37 AM

## 2017-11-03 ENCOUNTER — Encounter: Payer: Self-pay | Admitting: Gynecology

## 2017-11-03 LAB — COMPREHENSIVE METABOLIC PANEL
A/G RATIO: 1.9 (ref 1.2–2.2)
ALK PHOS: 69 IU/L (ref 39–117)
ALT: 14 IU/L (ref 0–32)
AST: 18 IU/L (ref 0–40)
Albumin: 4.6 g/dL (ref 3.5–4.8)
BILIRUBIN TOTAL: 0.4 mg/dL (ref 0.0–1.2)
BUN / CREAT RATIO: 18 (ref 12–28)
BUN: 16 mg/dL (ref 8–27)
CHLORIDE: 101 mmol/L (ref 96–106)
CO2: 22 mmol/L (ref 20–29)
Calcium: 9.7 mg/dL (ref 8.7–10.3)
Creatinine, Ser: 0.89 mg/dL (ref 0.57–1.00)
GFR calc Af Amer: 74 mL/min/{1.73_m2} (ref >59)
GFR calc non Af Amer: 64 mL/min/{1.73_m2} (ref >59)
GLOBULIN, TOTAL: 2.4 g/dL (ref 1.5–4.5)
GLUCOSE: 102 mg/dL — AB (ref 65–99)
POTASSIUM: 3.8 mmol/L (ref 3.5–5.2)
SODIUM: 138 mmol/L (ref 134–144)
Total Protein: 7 g/dL (ref 6.0–8.5)

## 2017-11-03 LAB — CBC
Hematocrit: 43.8 % (ref 34.0–46.6)
Hemoglobin: 14.1 g/dL (ref 11.1–15.9)
MCH: 28.6 pg (ref 26.6–33.0)
MCHC: 32.2 g/dL (ref 31.5–35.7)
MCV: 89 fL (ref 79–97)
PLATELETS: 217 10*3/uL (ref 150–450)
RBC: 4.93 x10E6/uL (ref 3.77–5.28)
RDW: 14.5 % (ref 12.3–15.4)
WBC: 4.7 10*3/uL (ref 3.4–10.8)

## 2017-11-03 LAB — VITAMIN D 25 HYDROXY (VIT D DEFICIENCY, FRACTURES): Vit D, 25-Hydroxy: 43.2 ng/mL (ref 30.0–100.0)

## 2017-11-03 LAB — TSH: TSH: 1.93 u[IU]/mL (ref 0.450–4.500)

## 2017-11-03 LAB — LIPID PANEL
CHOLESTEROL TOTAL: 221 mg/dL — AB (ref 100–199)
Chol/HDL Ratio: 2.9 ratio (ref 0.0–4.4)
HDL: 76 mg/dL (ref >39)
LDL Calculated: 127 mg/dL — ABNORMAL HIGH (ref 0–99)
Triglycerides: 91 mg/dL (ref 0–149)
VLDL Cholesterol Cal: 18 mg/dL (ref 5–40)

## 2017-11-20 ENCOUNTER — Ambulatory Visit: Payer: Medicare Other | Admitting: Gynecology

## 2017-11-20 ENCOUNTER — Encounter: Payer: Self-pay | Admitting: Gynecology

## 2017-11-20 VITALS — BP 144/86 | Ht 67.5 in | Wt 156.0 lb

## 2017-11-20 DIAGNOSIS — Z01419 Encounter for gynecological examination (general) (routine) without abnormal findings: Secondary | ICD-10-CM

## 2017-11-20 DIAGNOSIS — N952 Postmenopausal atrophic vaginitis: Secondary | ICD-10-CM

## 2017-11-20 DIAGNOSIS — M8580 Other specified disorders of bone density and structure, unspecified site: Secondary | ICD-10-CM | POA: Diagnosis not present

## 2017-11-20 DIAGNOSIS — N816 Rectocele: Secondary | ICD-10-CM

## 2017-11-20 NOTE — Progress Notes (Signed)
    Debra Frederick Jul 31, 1945 [id number]        72 y.o.  [id number] for annual gynecologic exam.  Doing well without gynecologic complaints.  Past medical history,surgical history, problem list, medications, allergies, family history and social history were all reviewed and documented as reviewed in the EPIC chart.  ROS:  Performed with pertinent positives and negatives included in the history, assessment and plan.   Additional significant findings : None   Exam: [name] assistant Vitals:   11/20/17 1408  BP: (!) 144/86  Weight: 156 lb (70.8 kg)  Height: 5' 7.5" (1.715 m)   Body mass index is 24.07 kg/m.  General appearance:  Normal affect, orientation and appearance. Skin: Grossly normal HEENT: Without gross lesions.  No cervical or supraclavicular adenopathy. Thyroid normal.  Lungs:  Clear without wheezing, rales or rhonchi Cardiac: RR, without RMG Abdominal:  Soft, nontender, without masses, guarding, rebound, organomegaly or hernia Breasts:  Examined lying and sitting without masses, retractions, discharge or axillary adenopathy. Pelvic:  Ext, BUS, Vagina: With atrophic changes.  Mild rectocele noted.  Cervix: With atrophic changes  Uterus: Anteverted, normal size, shape and contour, midline and mobile nontender   Adnexa: Without masses or tenderness    Anus and perineum: Normal   Rectovaginal: Normal sphincter tone without palpated masses or tenderness.    Assessment/Plan:  72 y.o. [id number] female for annual gynecologic exam.   1. Postmenopausal/atrophic genital changes.  No significant menopausal symptoms or any vaginal bleeding. 2. Osteopenia.  DEXA 2019 T score -2.2 FRAX 14% / 3.6%.  We discussed the increased hip fracture risk and options for medication.  The patient exercises on a regular basis.  She declines consideration of medication at this time.  We will plan on repeating her bone density in 2 years.  Recent vitamin D 43. 3. Mild rectocele.  Stable on exam  asymptomatic to the patient. 4. Mammography 09/2017.  Continue with annual mammography next year.  Breast exam normal today. 5. Colonoscopy 2015.  Repeat at their recommended interval. 6. Pap smear 2018.  No Pap smear done today.  Slight atypia in 1991 with negative colposcopy.  Options to stop screening versus less frequent screening intervals reviewed.  Patient reluctant to stop screening.  We will plan repeat Pap smear at 3-year interval. 7. Health maintenance.  Blood pressure mildly elevated 144/86.  Patient has appointment with her primary physician as they are following this now and she is taking her blood pressure at home.  She will continue to follow-up with them in reference to this.  No blood work drawn as it is done at their office.  Follow-up in 1 year, sooner as needed.   [name], 2:32 PM 11/20/2017

## 2017-11-20 NOTE — Patient Instructions (Signed)
Follow-up in 1 year, sooner as needed. 

## 2017-11-28 ENCOUNTER — Other Ambulatory Visit: Payer: Self-pay

## 2017-11-28 ENCOUNTER — Ambulatory Visit: Payer: Medicare Other | Admitting: Family Medicine

## 2017-11-28 ENCOUNTER — Encounter: Payer: Self-pay | Admitting: Family Medicine

## 2017-11-28 VITALS — BP 128/82 | HR 76 | Temp 98.2°F | Resp 16 | Ht 68.11 in | Wt 155.6 lb

## 2017-11-28 DIAGNOSIS — Z23 Encounter for immunization: Secondary | ICD-10-CM

## 2017-11-28 DIAGNOSIS — R030 Elevated blood-pressure reading, without diagnosis of hypertension: Secondary | ICD-10-CM | POA: Diagnosis not present

## 2017-11-28 DIAGNOSIS — F439 Reaction to severe stress, unspecified: Secondary | ICD-10-CM | POA: Diagnosis not present

## 2017-11-28 DIAGNOSIS — R002 Palpitations: Secondary | ICD-10-CM | POA: Diagnosis not present

## 2017-11-28 MED ORDER — METOPROLOL TARTRATE 25 MG PO TABS: 12.5000 mg | ORAL_TABLET | Freq: Two times a day (BID) | ORAL | 2 refills | Status: DC

## 2017-11-28 NOTE — Patient Instructions (Addendum)
  See previous paperwork regarding stress and stress management.  Continue exercise, try to increase sleep time.    Can try metoprolol 1/2 tablet twice per day that can help blood pressure as well as heart palpitations. But blood pressure better on recheck. Watch for lightheadedness or dizziness on that medication.  If heart palpitations persist or become more frequent I would recommend cardiology evaluation.  Follow-up within the next 6 to 8 weeks.   Return to the clinic or go to the nearest emergency room if any of your symptoms worsen or new symptoms occur.   If you have lab work done today you will be contacted with your lab results within the next 2 weeks.  If you have not heard from [place] then please contact us. The fastest way to get your results is to register for My Chart.   IF you received an x-ray today, you will receive an invoice from [facility]. Please contact [facility] at [phone] with questions or concerns regarding your invoice.   IF you received labwork today, you will receive an invoice from [facility]. Please contact [facility] at [phone] with questions or concerns regarding your invoice.   Our billing staff will not be able to assist you with questions regarding bills from these companies.  You will be contacted with the lab results as soon as they are available. The fastest way to get your results is to activate your My Chart account. Instructions are located on the last page of this paperwork. If you have not heard from [place] regarding the results in 2 weeks, please contact this office.

## 2017-11-28 NOTE — Progress Notes (Signed)
Subjective:  By signing my name below, I, [name], attest that this documentation has been prepared under the direction and in the presence of Wendie Agreste, MD Electronically Signed: Ladene Artist, ED Scribe 11/28/2017 at 9:55 AM.   Patient ID: Debra Frederick, female    DOB: 1945-07-27, 72 y.o.   MRN: [id number]  Chief Complaint  Patient presents with  . Anxiety    1 month follow-up , and Palpitations   . Hypertension   HPI Debra Frederick is a 72 y.o. female who presents to Primary Care at [facility] for f/u of anxiety and palpitations. Seen 8/3 for CPE. Noted palpitations, more freq x 1 yr, occasional extra beat. Did have increased stress and anxiety recently with family issues. EKG without acute findings. Normal TSH, CBC, mildly elevated lipid panel. 10 yr ASCVD risk 20%. Statin discussed. - Pt reports intermittent symptoms that can occur once every 3-4 days or 3-4 times in 1 day but not again for a few days. She does report difficulty sleeping; states it takes a while for her to unwind from her part-time job. States anxiety has improved some as her biggest stressor was dental work and her daughter's wedding. Since last OV, pt's daughter has gotten married and pt has already made the dental appointment. She also wakes early daily to practice yoga. Denies lightheadedness, dizziness, cp, sob when she experiences palpitations. Pt expresses that she would like to defer statin even longer at this time.  Elevated BP BP Readings from Last 3 Encounters:  11/28/17 (!) 148/86  11/20/17 (!) 144/86  11/01/17 (!) 156/88  Reported home readings much lower than office. Advised to return with meter. - Pt reports home BP reading of 130s/high 70s.  Patient Active Problem List   Diagnosis Date Noted  . Hematuria 10/13/2014  . Bursitis   . History of shingles   . Osteopenia   . Vitamin D deficiency    Past Medical History:  Diagnosis Date  . Bursitis   . Cancer ([place])    basal cell CA x2-  thigh and left chest-90's  . Cataract   . History of shingles 2010  . Osteopenia 10/2017   T score -2.2 FRAX 14% / 3.6%   Past Surgical History:  Procedure Laterality Date  . basal cell excised    . BREAST LUMPECTOMY     Benign  . CATARACT EXTRACTION    . COLONOSCOPY    . DILATION AND CURETTAGE OF UTERUS    . macular eye surg    . TUBAL LIGATION     Allergies  Allergen Reactions  . Codeine Other (See Comments)    Jittery  . Penicillins Rash   Prior to Admission medications   Medication Sig Start Date End Date Taking? Authorizing Provider  Calcium Carb-Cholecalciferol (CALCIUM 1000 + D PO) Take by mouth.    [provider]  Cholecalciferol (VITAMIN D PO) Take by mouth.    [provider]  fish oil-omega-3 fatty acids 1000 MG capsule Take 1 g by mouth daily. Reported on 09/22/2015    [provider]  Flaxseed, Linseed, (FLAX SEED OIL PO) Take by mouth.    [provider]  vitamin C (ASCORBIC ACID) 500 MG tablet Take 500 mg by mouth daily.    [provider]   Social History   Socioeconomic History  . Marital status: Married    Spouse name: Not on file  . Number of children: Not on file  . Years of  education: Not on file  . Highest education level: Not on file  Occupational History  . Not on file  Social Needs  . Financial resource strain: Not on file  . Food insecurity:    Worry: Not on file    Inability: Not on file  . Transportation needs:    Medical: Not on file    Non-medical: Not on file  Tobacco Use  . Smoking status: Never Smoker  . Smokeless tobacco: Never Used  Substance and Sexual Activity  . Alcohol use: Yes    Alcohol/week: 0.0 standard drinks    Comment: Rare  . Drug use: No  . Sexual activity: Not Currently    Birth control/protection: Surgical, Post-menopausal    Comment: BTL-1st intercourse 61 yo-1 partner  Lifestyle  . Physical activity:    Days per week: Not on file    Minutes per session: Not  on file  . Stress: Not on file  Relationships  . Social connections:    Talks on phone: Not on file    Gets together: Not on file    Attends religious service: Not on file    Active member of club or organization: Not on file    Attends meetings of clubs or organizations: Not on file    Relationship status: Not on file  . Intimate partner violence:    Fear of current or ex partner: Not on file    Emotionally abused: Not on file    Physically abused: Not on file    Forced sexual activity: Not on file  Other Topics Concern  . Not on file  Social History Narrative  . Not on file   Review of Systems  Respiratory: Negative for shortness of breath.   Cardiovascular: Positive for palpitations. Negative for chest pain.  Neurological: Negative for dizziness and light-headedness.  Psychiatric/Behavioral: Positive for sleep disturbance. The patient is nervous/anxious.       Objective:   Physical Exam  Constitutional: She is oriented to person, place, and time. She appears well-developed and well-nourished.  HENT:  Head: Normocephalic and atraumatic.  Eyes: Pupils are equal, round, and reactive to light. Conjunctivae and EOM are normal.  Neck: Carotid bruit is not present.  Cardiovascular: Normal rate, regular rhythm, normal heart sounds and intact distal pulses.  Pulmonary/Chest: Effort normal and breath sounds normal.  Abdominal: Soft. She exhibits no pulsatile midline mass. There is no tenderness.  Neurological: She is alert and oriented to person, place, and time.  Skin: Skin is warm and dry.  Psychiatric: She has a normal mood and affect. Her behavior is normal.  Vitals reviewed.  Vitals:   11/28/17 0944 11/28/17 1022  BP: (!) 148/86 128/82  Pulse: 76   Resp: 16   Temp: 98.2 F (36.8 C)   TempSrc: Oral   SpO2: 96%   Weight: 155 lb 9.6 oz (70.6 kg)   Height: 5' 8.11" (1.73 m)       Assessment & Plan:   Debra Frederick is a 72 y.o. female Palpitations - Plan:  metoprolol tartrate (LOPRESSOR) 25 MG tablet Elevated blood pressure reading - Plan: metoprolol tartrate (LOPRESSOR) 25 MG tablet Situational stress - Plan: metoprolol tartrate (LOPRESSOR) 25 MG tablet  -Stress management handout given previously.  Recommended increase sleep.  Borderline elevated blood pressure again here as well as other offices, but improved on recheck.    - Option of low-dose metoprolol 12.5 mg twice daily to help with palpitations as well as blood pressure, but potential  side effects were discussed.  If persistent palpitations would recommend cardiology eval.  RTC precautions.   Need for prophylactic vaccination and inoculation against influenza - Plan: Flu vaccine HIGH DOSE PF (Fluzone High dose), CANCELED: Flu Vaccine QUAD 36+ mos IM   Meds ordered this encounter  Medications  . metoprolol tartrate (LOPRESSOR) 25 MG tablet    Sig: Take 0.5 tablets (12.5 mg total) by mouth 2 (two) times daily.    Dispense:  30 tablet    Refill:  2   Patient Instructions     See previous paperwork regarding stress and stress management.  Continue exercise, try to increase sleep time.    Can try metoprolol 1/2 tablet twice per day that can help blood pressure as well as heart palpitations.  Watch for lightheadedness or dizziness on that medication.  If heart palpitations persist or become more frequent I would recommend cardiology evaluation.  Follow-up within the next 6 to 8 weeks.   Return to the clinic or go to the nearest emergency room if any of your symptoms worsen or new symptoms occur.   If you have lab work done today you will be contacted with your lab results within the next 2 weeks.  If you have not heard from [place] then please contact us. The fastest way to get your results is to register for My Chart.   IF you received an x-ray today, you will receive an invoice from [facility]. Please contact [facility] at [phone] with questions or concerns  regarding your invoice.   IF you received labwork today, you will receive an invoice from [facility]. Please contact [facility] at [phone] with questions or concerns regarding your invoice.   Our billing staff will not be able to assist you with questions regarding bills from these companies.  You will be contacted with the lab results as soon as they are available. The fastest way to get your results is to activate your My Chart account. Instructions are located on the last page of this paperwork. If you have not heard from [place] regarding the results in 2 weeks, please contact this office.       I personally performed the services described in this documentation, which was scribed in my presence. The recorded information has been reviewed and considered for accuracy and completeness, addended by me as needed, and agree with information above.  Signed,   Merri Ray, MD [facility] [place].  11/28/17 10:38 AM

## 2018-01-07 ENCOUNTER — Ambulatory Visit (INDEPENDENT_AMBULATORY_CARE_PROVIDER_SITE_OTHER): Payer: Medicare Other | Admitting: Family Medicine

## 2018-01-07 ENCOUNTER — Encounter: Payer: Self-pay | Admitting: Family Medicine

## 2018-01-07 VITALS — BP 144/82 | HR 79 | Temp 98.0°F | Resp 17 | Ht 68.0 in | Wt 156.0 lb

## 2018-01-07 DIAGNOSIS — F439 Reaction to severe stress, unspecified: Secondary | ICD-10-CM | POA: Diagnosis not present

## 2018-01-07 DIAGNOSIS — R002 Palpitations: Secondary | ICD-10-CM | POA: Diagnosis not present

## 2018-01-07 DIAGNOSIS — G4700 Insomnia, unspecified: Secondary | ICD-10-CM

## 2018-01-07 MED ORDER — HYDROXYZINE HCL 10 MG PO TABS: 5.0000 mg | ORAL_TABLET | Freq: Every evening | ORAL | 0 refills | Status: DC | PRN

## 2018-01-07 NOTE — Patient Instructions (Addendum)
I will refer you to cardiology for palpitations, but okay to continue metoprolol low-dose extremity if that is helpful.  If any lightheadedness or dizziness stop that medication.  If needed, can take hydroxyzine low-dose to help sleep.  Stop if any new side effects.  See information on managing stress below.  Please let me know if I can help during this time.   Palpitations A palpitation is the feeling that your heartbeat is irregular or is faster than normal. It may feel like your heart is fluttering or skipping a beat. Palpitations are usually not a serious problem. They may be caused by many things, including smoking, caffeine, alcohol, stress, and certain medicines. Although most causes of palpitations are not serious, palpitations can be a sign of a serious medical problem. In some cases, you may need further medical evaluation. Follow these instructions at home: Pay attention to any changes in your symptoms. Take these actions to help with your condition:  Avoid the following: ? Caffeinated coffee, tea, soft drinks, diet pills, and energy drinks. ? Chocolate. ? Alcohol.  Do not use any tobacco products, such as cigarettes, chewing tobacco, and e-cigarettes. If you need help quitting, ask your health care provider.  Try to reduce your stress and anxiety. Things that can help you relax include: ? Yoga. ? Meditation. ? Physical activity, such as swimming, jogging, or walking. ? Biofeedback. This is a method that helps you learn to use your mind to control things in your body, such as your heartbeats.  Get plenty of rest and sleep.  Take over-the-counter and prescription medicines only as told by your health care provider.  Keep all follow-up visits as told by your health care provider. This is important.  Contact a health care provider if:  You continue to have a fast or irregular heartbeat after 24 hours.  Your palpitations occur more often. Get help right away if:  You have  chest pain or shortness of breath.  You have a severe headache.  You feel dizzy or you faint. This information is not intended to replace advice given to you by your health care provider. Make sure you discuss any questions you have with your health care provider. Document Released: 03/15/2000 Document Revised: 08/21/2015 Document Reviewed: 12/01/2014 Elsevier Interactive Patient Education  2018 [place] and Stress Management Stress is a normal reaction to life events. It is what you feel when life demands more than you are used to or more than you can handle. Some stress can be useful. For example, the stress reaction can help you catch the last bus of the day, study for a test, or meet a deadline at work. But stress that occurs too often or for too long can cause problems. It can affect your emotional health and interfere with relationships and normal daily activities. Too much stress can weaken your immune system and increase your risk for physical illness. If you already have a medical problem, stress can make it worse. What are the causes? All sorts of life events may cause stress. An event that causes stress for one person may not be stressful for another person. Major life events commonly cause stress. These may be positive or negative. Examples include losing your job, moving into a new home, getting married, having a baby, or losing a loved one. Less obvious life events may also cause stress, especially if they occur day after day or in combination. Examples include working long hours, driving in traffic, caring for  children, being in debt, or being in a difficult relationship. What are the signs or symptoms? Stress may cause emotional symptoms including, the following:  Anxiety. This is feeling worried, afraid, on edge, overwhelmed, or out of control.  Anger. This is feeling irritated or impatient.  Depression. This is feeling sad, down, helpless, or  guilty.  Difficulty focusing, remembering, or making decisions.  Stress may cause physical symptoms, including the following:  Aches and pains. These may affect your head, neck, back, stomach, or other areas of your body.  Tight muscles or clenched jaw.  Low energy or trouble sleeping.  Stress may cause unhealthy behaviors, including the following:  Eating to feel better (overeating) or skipping meals.  Sleeping too little, too much, or both.  Working too much or putting off tasks (procrastination).  Smoking, drinking alcohol, or using drugs to feel better.  How is this diagnosed? Stress is diagnosed through an assessment by your health care provider. Your health care provider will ask questions about your symptoms and any stressful life events.Your health care provider will also ask about your medical history and may order blood tests or other tests. Certain medical conditions and medicine can cause physical symptoms similar to stress. Mental illness can cause emotional symptoms and unhealthy behaviors similar to stress. Your health care provider may refer you to a mental health professional for further evaluation. How is this treated? Stress management is the recommended treatment for stress.The goals of stress management are reducing stressful life events and coping with stress in healthy ways. Techniques for reducing stressful life events include the following:  Stress identification. Self-monitor for stress and identify what causes stress for you. These skills may help you to avoid some stressful events.  Time management. Set your priorities, keep a calendar of events, and learn to say "no." These tools can help you avoid making too many commitments.  Techniques for coping with stress include the following:  Rethinking the problem. Try to think realistically about stressful events rather than ignoring them or overreacting. Try to find the positives in a stressful situation  rather than focusing on the negatives.  Exercise. Physical exercise can release both physical and emotional tension. The key is to find a form of exercise you enjoy and do it regularly.  Relaxation techniques. These relax the body and mind. Examples include yoga, meditation, tai chi, biofeedback, deep breathing, progressive muscle relaxation, listening to music, being out in nature, journaling, and other hobbies. Again, the key is to find one or more that you enjoy and can do regularly.  Healthy lifestyle. Eat a balanced diet, get plenty of sleep, and do not smoke. Avoid using alcohol or drugs to relax.  Strong support network. Spend time with family, friends, or other people you enjoy being around.Express your feelings and talk things over with someone you trust.  Counseling or talktherapy with a mental health professional may be helpful if you are having difficulty managing stress on your own. Medicine is typically not recommended for the treatment of stress.Talk to your health care provider if you think you need medicine for symptoms of stress. Follow these instructions at home:  Keep all follow-up visits as directed by your health care provider.  Take all medicines as directed by your health care provider. Contact a health care provider if:  Your symptoms get worse or you start having new symptoms.  You feel overwhelmed by your problems and can no longer manage them on your own. Get help right away if:  You feel like hurting yourself or someone else. This information is not intended to replace advice given to you by your health care provider. Make sure you discuss any questions you have with your health care provider. Document Released: 09/11/2000 Document Revised: 08/24/2015 Document Reviewed: 11/10/2012 Elsevier Interactive Patient Education  [employer].   If you have lab work done today you will be contacted with your lab results within the next 2 weeks.  If you have not  heard from [place] then please contact us. The fastest way to get your results is to register for My Chart.   IF you received an x-ray today, you will receive an invoice from [facility]. Please contact [facility] at [phone] with questions or concerns regarding your invoice.   IF you received labwork today, you will receive an invoice from [place]. Please contact [facility] at [phone] with questions or concerns regarding your invoice.   Our billing staff will not be able to assist you with questions regarding bills from these companies.  You will be contacted with the lab results as soon as they are available. The fastest way to get your results is to activate your My Chart account. Instructions are located on the last page of this paperwork. If you have not heard from [place] regarding the results in 2 weeks, please contact this office.

## 2018-01-07 NOTE — Progress Notes (Signed)
Subjective:  By signing my name below, I, [name], attest that this documentation has been prepared under the direction and in the presence of Wendie Agreste, MD Electronically Signed: Ladene Artist, ED Scribe 01/07/2018 at 2:37 PM.   Patient ID: Debra Frederick, female    DOB: May 31, 1945, 72 y.o.   MRN: [id number]  Chief Complaint  Patient presents with  . Palpitations   HPI  Debra Frederick is a 72 y.o. female who presents to Primary Care at [facility] for f/u on palpitations. Last seen 8/30. Suspected situational stress contributing and decreased sleep. She was having palpitations once every 3-4 days, occasionally within a day then would space out symptoms. Option of metoprolol 12.5 mg bid given at last OV and hand-out on stress. Normal TSH, CBC 8/3. EKG 8/3 sinus bradycardia rate 58 but HR 76 at last OV.  Pt takes 1/2 tab in the morning which has improved both anxiety and palpitations, but they still occur daily (only during the day, denies palpitations at night). She is also still having some difficulty sleeping as she is constantly stressed about Bill's health who is scheduled to have a nuclear scan soon. Reports home BP readings ranging 103-129/55-80. Denies lightheadedness, dizziness, hair loss. She has not tried hydroxyzine or metoprolol at night. Pt declines meeting with a therapist at this time.  Patient Active Problem List   Diagnosis Date Noted  . Hematuria 10/13/2014  . Bursitis   . History of shingles   . Osteopenia   . Vitamin D deficiency    Past Medical History:  Diagnosis Date  . Bursitis   . Cancer ([place])    basal cell CA x2- thigh and left chest-90's  . Cataract   . History of shingles 2010  . Osteopenia 10/2017   T score -2.2 FRAX 14% / 3.6%   Past Surgical History:  Procedure Laterality Date  . basal cell excised    . BREAST LUMPECTOMY     Benign  . CATARACT EXTRACTION    . COLONOSCOPY    . DILATION AND CURETTAGE OF UTERUS    . macular eye surg      . TUBAL LIGATION     Allergies  Allergen Reactions  . Codeine Other (See Comments)    Jittery  . Penicillins Rash   Prior to Admission medications   Medication Sig Start Date End Date Taking? Authorizing Provider  Calcium Carb-Cholecalciferol (CALCIUM 1000 + D PO) Take by mouth.    [provider]  Cholecalciferol (VITAMIN D PO) Take by mouth.    [provider]  fish oil-omega-3 fatty acids 1000 MG capsule Take 1 g by mouth daily. Reported on 09/22/2015    [provider]  Flaxseed, Linseed, (FLAX SEED OIL PO) Take by mouth.    [provider]  metoprolol tartrate (LOPRESSOR) 25 MG tablet Take 0.5 tablets (12.5 mg total) by mouth 2 (two) times daily. 11/28/17   Wendie Agreste, MD  vitamin C (ASCORBIC ACID) 500 MG tablet Take 500 mg by mouth daily.    [provider]   Social History   Socioeconomic History  . Marital status: Married    Spouse name: Not on file  . Number of children: Not on file  . Years of education: Not on file  . Highest education level: Not on file  Occupational History  . Not on file  Social Needs  . Financial resource strain: Not on file  . Food insecurity:    Worry:  Not on file    Inability: Not on file  . Transportation needs:    Medical: Not on file    Non-medical: Not on file  Tobacco Use  . Smoking status: Never Smoker  . Smokeless tobacco: Never Used  Substance and Sexual Activity  . Alcohol use: Yes    Alcohol/week: 0.0 standard drinks    Comment: Rare  . Drug use: No  . Sexual activity: Not Currently    Birth control/protection: Surgical, Post-menopausal    Comment: BTL-1st intercourse 80 yo-1 partner  Lifestyle  . Physical activity:    Days per week: Not on file    Minutes per session: Not on file  . Stress: Not on file  Relationships  . Social connections:    Talks on phone: Not on file    Gets together: Not on file    Attends religious service: Not on file    Active member of club  or organization: Not on file    Attends meetings of clubs or organizations: Not on file    Relationship status: Not on file  . Intimate partner violence:    Fear of current or ex partner: Not on file    Emotionally abused: Not on file    Physically abused: Not on file    Forced sexual activity: Not on file  Other Topics Concern  . Not on file  Social History Narrative  . Not on file   Review of Systems  Cardiovascular: Positive for palpitations (improved).  Neurological: Positive for speech difficulty. Negative for dizziness and light-headedness.  Psychiatric/Behavioral: The patient is nervous/anxious (improved).       Objective:   Physical Exam  Constitutional: She is oriented to person, place, and time. She appears well-developed and well-nourished. No distress.  HENT:  Head: Normocephalic and atraumatic.  Eyes: Pupils are equal, round, and reactive to light. Conjunctivae and EOM are normal.  Neck: Neck supple. Carotid bruit is not present. No tracheal deviation present.  Cardiovascular: Normal rate, regular rhythm, normal heart sounds and intact distal pulses.  Pulmonary/Chest: Effort normal and breath sounds normal. No respiratory distress.  Musculoskeletal: Normal range of motion.  Neurological: She is alert and oriented to person, place, and time.  Skin: Skin is warm and dry.  Psychiatric: She has a normal mood and affect. Her behavior is normal.  Nursing note and vitals reviewed.  Vitals:   01/07/18 1422 01/07/18 1430  BP: (!) 148/84 (!) 144/82  Pulse: 79   Resp: 17   Temp: 98 F (36.7 C)   TempSrc: Oral   SpO2: 98%   Weight: 156 lb (70.8 kg)   Height: '5\' 8"'$  (1.727 m)       Assessment & Plan:  Debra Frederick is a 72 y.o. female Palpitations - Plan: Ambulatory referral to Cardiology  Insomnia, unspecified type - Plan: hydrOXYzine (ATARAX/VISTARIL) 10 MG tablet  Situational stress - Plan: hydrOXYzine (ATARAX/VISTARIL) 10 MG tablet  -Still intermittent  palpitations, reassuring previous work-up.  Suspect some component of situational stress, now with new stressor with husband's health.  -Tolerating low-dose metoprolol in the morning, okay to continue same, but will refer to cardiology to decide if further work-up indicated.  -Handout given on palpitations and situational stress and coping techniques discussed.  -Low-dose hydroxyzine if needed at bedtime to help with insomnia, potential side effects discussed. RTC Precautions given  Meds ordered this encounter  Medications  . hydrOXYzine (ATARAX/VISTARIL) 10 MG tablet    Sig: Take 0.5-1 tablets (5-10 mg  total) by mouth at bedtime as needed.    Dispense:  30 tablet    Refill:  0   Patient Instructions    I will refer you to cardiology for palpitations, but okay to continue metoprolol low-dose extremity if that is helpful.  If any lightheadedness or dizziness stop that medication.  If needed, can take hydroxyzine low-dose to help sleep.  Stop if any new side effects.  See information on managing stress below.  Please let me know if I can help during this time.   Palpitations A palpitation is the feeling that your heartbeat is irregular or is faster than normal. It may feel like your heart is fluttering or skipping a beat. Palpitations are usually not a serious problem. They may be caused by many things, including smoking, caffeine, alcohol, stress, and certain medicines. Although most causes of palpitations are not serious, palpitations can be a sign of a serious medical problem. In some cases, you may need further medical evaluation. Follow these instructions at home: Pay attention to any changes in your symptoms. Take these actions to help with your condition:  Avoid the following: ? Caffeinated coffee, tea, soft drinks, diet pills, and energy drinks. ? Chocolate. ? Alcohol.  Do not use any tobacco products, such as cigarettes, chewing tobacco, and e-cigarettes. If you need help quitting,  ask your health care provider.  Try to reduce your stress and anxiety. Things that can help you relax include: ? Yoga. ? Meditation. ? Physical activity, such as swimming, jogging, or walking. ? Biofeedback. This is a method that helps you learn to use your mind to control things in your body, such as your heartbeats.  Get plenty of rest and sleep.  Take over-the-counter and prescription medicines only as told by your health care provider.  Keep all follow-up visits as told by your health care provider. This is important.  Contact a health care provider if:  You continue to have a fast or irregular heartbeat after 24 hours.  Your palpitations occur more often. Get help right away if:  You have chest pain or shortness of breath.  You have a severe headache.  You feel dizzy or you faint. This information is not intended to replace advice given to you by your health care provider. Make sure you discuss any questions you have with your health care provider. Document Released: 03/15/2000 Document Revised: 08/21/2015 Document Reviewed: 12/01/2014 Elsevier Interactive Patient Education  2018 [place] and Stress Management Stress is a normal reaction to life events. It is what you feel when life demands more than you are used to or more than you can handle. Some stress can be useful. For example, the stress reaction can help you catch the last bus of the day, study for a test, or meet a deadline at work. But stress that occurs too often or for too long can cause problems. It can affect your emotional health and interfere with relationships and normal daily activities. Too much stress can weaken your immune system and increase your risk for physical illness. If you already have a medical problem, stress can make it worse. What are the causes? All sorts of life events may cause stress. An event that causes stress for one person may not be stressful for another person. Major  life events commonly cause stress. These may be positive or negative. Examples include losing your job, moving into a new home, getting married, having a baby, or losing a loved one.  Less obvious life events may also cause stress, especially if they occur day after day or in combination. Examples include working long hours, driving in traffic, caring for children, being in debt, or being in a difficult relationship. What are the signs or symptoms? Stress may cause emotional symptoms including, the following:  Anxiety. This is feeling worried, afraid, on edge, overwhelmed, or out of control.  Anger. This is feeling irritated or impatient.  Depression. This is feeling sad, down, helpless, or guilty.  Difficulty focusing, remembering, or making decisions.  Stress may cause physical symptoms, including the following:  Aches and pains. These may affect your head, neck, back, stomach, or other areas of your body.  Tight muscles or clenched jaw.  Low energy or trouble sleeping.  Stress may cause unhealthy behaviors, including the following:  Eating to feel better (overeating) or skipping meals.  Sleeping too little, too much, or both.  Working too much or putting off tasks (procrastination).  Smoking, drinking alcohol, or using drugs to feel better.  How is this diagnosed? Stress is diagnosed through an assessment by your health care provider. Your health care provider will ask questions about your symptoms and any stressful life events.Your health care provider will also ask about your medical history and may order blood tests or other tests. Certain medical conditions and medicine can cause physical symptoms similar to stress. Mental illness can cause emotional symptoms and unhealthy behaviors similar to stress. Your health care provider may refer you to a mental health professional for further evaluation. How is this treated? Stress management is the recommended treatment for  stress.The goals of stress management are reducing stressful life events and coping with stress in healthy ways. Techniques for reducing stressful life events include the following:  Stress identification. Self-monitor for stress and identify what causes stress for you. These skills may help you to avoid some stressful events.  Time management. Set your priorities, keep a calendar of events, and learn to say "no." These tools can help you avoid making too many commitments.  Techniques for coping with stress include the following:  Rethinking the problem. Try to think realistically about stressful events rather than ignoring them or overreacting. Try to find the positives in a stressful situation rather than focusing on the negatives.  Exercise. Physical exercise can release both physical and emotional tension. The key is to find a form of exercise you enjoy and do it regularly.  Relaxation techniques. These relax the body and mind. Examples include yoga, meditation, tai chi, biofeedback, deep breathing, progressive muscle relaxation, listening to music, being out in nature, journaling, and other hobbies. Again, the key is to find one or more that you enjoy and can do regularly.  Healthy lifestyle. Eat a balanced diet, get plenty of sleep, and do not smoke. Avoid using alcohol or drugs to relax.  Strong support network. Spend time with family, friends, or other people you enjoy being around.Express your feelings and talk things over with someone you trust.  Counseling or talktherapy with a mental health professional may be helpful if you are having difficulty managing stress on your own. Medicine is typically not recommended for the treatment of stress.Talk to your health care provider if you think you need medicine for symptoms of stress. Follow these instructions at home:  Keep all follow-up visits as directed by your health care provider.  Take all medicines as directed by your health  care provider. Contact a health care provider if:  Your symptoms get worse  or you start having new symptoms.  You feel overwhelmed by your problems and can no longer manage them on your own. Get help right away if:  You feel like hurting yourself or someone else. This information is not intended to replace advice given to you by your health care provider. Make sure you discuss any questions you have with your health care provider. Document Released: 09/11/2000 Document Revised: 08/24/2015 Document Reviewed: 11/10/2012 Elsevier Interactive Patient Education  [employer].   If you have lab work done today you will be contacted with your lab results within the next 2 weeks.  If you have not heard from [place] then please contact us. The fastest way to get your results is to register for My Chart.   IF you received an x-ray today, you will receive an invoice from [facility]. Please contact [facility] at [phone] with questions or concerns regarding your invoice.   IF you received labwork today, you will receive an invoice from [place]. Please contact [facility] at [phone] with questions or concerns regarding your invoice.   Our billing staff will not be able to assist you with questions regarding bills from these companies.  You will be contacted with the lab results as soon as they are available. The fastest way to get your results is to activate your My Chart account. Instructions are located on the last page of this paperwork. If you have not heard from [place] regarding the results in 2 weeks, please contact this office.       I personally performed the services described in this documentation, which was scribed in my presence. The recorded information has been reviewed and considered for accuracy and completeness, addended by me as needed, and agree with information above.  Signed,   Merri Ray, MD [facility].    01/07/18 3:18 PM

## 2018-01-22 ENCOUNTER — Encounter: Payer: Self-pay | Admitting: Cardiovascular Disease

## 2018-01-22 ENCOUNTER — Ambulatory Visit: Payer: Medicare Other | Admitting: Cardiovascular Disease

## 2018-01-22 VITALS — BP 150/85 | HR 84 | Ht 67.0 in | Wt 155.8 lb

## 2018-01-22 DIAGNOSIS — R030 Elevated blood-pressure reading, without diagnosis of hypertension: Secondary | ICD-10-CM

## 2018-01-22 DIAGNOSIS — R002 Palpitations: Secondary | ICD-10-CM | POA: Diagnosis not present

## 2018-01-22 DIAGNOSIS — E7800 Pure hypercholesterolemia, unspecified: Secondary | ICD-10-CM | POA: Diagnosis not present

## 2018-01-22 NOTE — Patient Instructions (Addendum)
Medication Instructions:  Your physician recommends that you continue on your current medications as directed. Please refer to the Current Medication list given to you today.   If you need a refill on your cardiac medications before your next appointment, please call your pharmacy.   Lab work: NONE  Testing/Procedures: Your physician has recommended that you wear a holter monitor. Holter monitors are medical devices that record the heart's electrical activity. Doctors most often use these monitors to diagnose arrhythmias. Arrhythmias are problems with the speed or rhythm of the heartbeat. The monitor is a small, portable device. You can wear one while you do your normal daily activities. This is usually used to diagnose what is causing palpitations/syncope (passing out). [facility] [address]  Follow-Up: Your physician recommends that you schedule a follow-up appointment in: [place]  Your physician recommends that you schedule a follow-up appointment in: AS NEEDED    Holter Monitoring A Holter monitor is a small device that is used to detect abnormal heart rhythms. It clips to your clothing and is connected by wires to flat, sticky disks (electrodes) that attach to your chest. It is worn continuously for 24-48 hours. Follow these instructions at home:  Wear your Holter monitor at all times, even while exercising and sleeping, for as long as directed by your health care provider.  Make sure that the Holter monitor is safely clipped to your clothing or close to your body as recommended by your health care provider.  Do not get the monitor or wires wet.  Do not put body lotion or moisturizer on your chest.  Keep your skin clean.  Keep a diary of your daily activities, such as walking and doing chores. If you feel that your heartbeat is abnormal or that your heart is fluttering or skipping a beat: ? Record what you are doing when it  happens. ? Record what time of day the symptoms occur.  Return your Holter monitor as directed by your health care provider.  Keep all follow-up visits as directed by your health care provider. This is important. Get help right away if:  You feel lightheaded or you faint.  You have trouble breathing.  You feel pain in your chest, upper arm, or jaw.  You feel sick to your stomach and your skin is pale, cool, or damp.  You heartbeat feels unusual or abnormal. This information is not intended to replace advice given to you by your health care provider. Make sure you discuss any questions you have with your health care provider. Document Released: 12/15/2003 Document Revised: 08/24/2015 Document Reviewed: 10/25/2013 Elsevier Interactive Patient Education  [employer].

## 2018-01-22 NOTE — Progress Notes (Signed)
Cardiology Office Note   Date:  01/25/2018   ID:  Debra Frederick, DOB 15-Jun-1945, MRN [id number]  PCP:  Wendie Agreste, MD  Cardiologist:   Skeet Latch, MD   Chief Complaint  Patient presents with  . New Patient (Initial Visit)    Pt states no Sx      History of Present Illness: Debra Frederick is a 72 y.o. female who is being seen today for the evaluation of palpitations at the request of Wendie Agreste, MD.  For the last 6 or 8 months Ms. Larranaga has noted increasing palpitations.  In the past that happened only once every few months.  However lately she has noted daily heart fluttering.  It lasts for seconds or two.  There is no associated chest pain, shortness of breath, or lightheadedness.  She exercises daily and it never happens with exertion.  She only notes that at time of rest.  She does not drink any caffeine and does not use any over-the-counter cold or cough medications regularly.  She has been feeling anxious about some dental issues that need to be addressed and her husband was recently diagnosed with prostate cancer.  He notes that she is always anxious when she goes to the doctor's office and her blood pressure is typically elevated.  At home her blood pressure is in the 110s over 70s.  Her PCP started her on metoprolol to help her palpitations.  She has not noted any change in the palpitations but does feel like it made her feel more relaxed.  She denies any lower extremity edema, orthopnea, or PND.   Past Medical History:  Diagnosis Date  . Bursitis   . Cancer ([place])    basal cell CA x2- thigh and left chest-90's  . Cataract   . History of shingles 2010  . Osteopenia 10/2017   T score -2.2 FRAX 14% / 3.6%    Past Surgical History:  Procedure Laterality Date  . basal cell excised    . BREAST LUMPECTOMY     Benign  . CATARACT EXTRACTION    . COLONOSCOPY    . DILATION AND CURETTAGE OF UTERUS    . macular eye surg    . TUBAL LIGATION        Current Outpatient Medications  Medication Sig Dispense Refill  . Calcium Carb-Cholecalciferol (CALCIUM 1000 + D PO) Take by mouth.    . Cholecalciferol (VITAMIN D PO) Take by mouth.    . fish oil-omega-3 fatty acids 1000 MG capsule Take 1 g by mouth daily. Reported on 09/22/2015    . Flaxseed, Linseed, (FLAX SEED OIL PO) Take by mouth.    . hydrOXYzine (ATARAX/VISTARIL) 10 MG tablet Take 0.5-1 tablets (5-10 mg total) by mouth at bedtime as needed. 30 tablet 0  . metoprolol tartrate (LOPRESSOR) 25 MG tablet Take 0.5 tablets (12.5 mg total) by mouth 2 (two) times daily. 30 tablet 2  . vitamin C (ASCORBIC ACID) 500 MG tablet Take 500 mg by mouth daily.     No current facility-administered medications for this visit.     Allergies:   Codeine and Penicillins    Social History:  The patient  reports that she has never smoked. She has never used smokeless tobacco. She reports that she drinks alcohol. She reports that she does not use drugs.   Family History:  The patient's family history includes Breast cancer in her mother; CAD in her father; Liver cancer in her father;  Stroke in her maternal grandmother.    ROS:  Please see the history of present illness.   Otherwise, review of systems are positive for none.   All other systems are reviewed and negative.    PHYSICAL EXAM: VS:  BP (!) 150/85   Pulse 84   Ht 5\' 7"  (1.702 m)   Wt 155 lb 12.8 oz (70.7 kg)   BMI 24.40 kg/m  , BMI Body mass index is 24.4 kg/m. GENERAL:  Well appearing HEENT:  Pupils equal round and reactive, fundi not visualized, oral mucosa unremarkable NECK:  No jugular venous distention, waveform within normal limits, carotid upstroke brisk and symmetric, no bruits, no thyromegaly LYMPHATICS:  No cervical adenopathy LUNGS:  Clear to auscultation bilaterally HEART:  RRR.  PMI not displaced or sustained,S1 and S2 within normal limits, no S3, no S4, no clicks, no rubs, no murmurs ABD:  Flat, positive bowel sounds  normal in frequency in pitch, no bruits, no rebound, no guarding, no midline pulsatile mass, no hepatomegaly, no splenomegaly EXT:  2 plus pulses throughout, no edema, no cyanosis no clubbing SKIN:  No rashes no nodules NEURO:  Cranial nerves II through XII grossly intact, motor grossly intact throughout PSYCH:  Cognitively intact, oriented to person place and time   EKG:  EKG is not ordered today.   Recent Labs: 11/01/2017: ALT 14; BUN 16; Creatinine, Ser 0.89; Hemoglobin 14.1; Platelets 217; Potassium 3.8; Sodium 138; TSH 1.930    Lipid Panel    Component Value Date/Time   CHOL 221 (H) 11/01/2017 1427   TRIG 91 11/01/2017 1427   HDL 76 11/01/2017 1427   CHOLHDL 2.9 11/01/2017 1427   CHOLHDL 2.8 10/19/2015 0852   VLDL 21 10/19/2015 0852   LDLCALC 127 (H) 11/01/2017 1427      Wt Readings from Last 3 Encounters:  01/22/18 155 lb 12.8 oz (70.7 kg)  01/07/18 156 lb (70.8 kg)  11/28/17 155 lb 9.6 oz (70.6 kg)      ASSESSMENT AND PLAN:  # Palpitations:  Ms. Jasmer symptoms sound very consistent with PACs and PVCs.  She had all the appropriate laboratory testing in August including thyroid function and this was unremarkable.  No need to repeat.  We will get a 24-hour Holter.  If she continues to have symptom medic palpitations we could consider increasing her dose of metoprolol to 25 mg.  # Hyperlipidemia:  ASCVD 10 year risk 16%.  Continue fish oil and will discuss statin at follow up.  # White coat hypertension: BP is elevated here but has been low at home.  I have asked her to track her BP at home.    Current medicines are reviewed at length with the patient today.  The patient does not have concerns regarding medicines.  The following changes have been made:  no change  Labs/ tests ordered today include:   Orders Placed This Encounter  Procedures  . Holter monitor - 24 hour     Disposition:   FU with Madgeline Rayo C. Oval Linsey, MD, [facility] as needed.  APP in 1 month.      Signed, Stephanos Fan C. Oval Linsey, MD, [facility]  01/25/2018 9:06 PM    [facility]

## 2018-01-25 ENCOUNTER — Encounter: Payer: Self-pay | Admitting: Cardiovascular Disease

## 2018-01-29 ENCOUNTER — Telehealth: Payer: Self-pay | Admitting: *Deleted

## 2018-01-29 ENCOUNTER — Ambulatory Visit (INDEPENDENT_AMBULATORY_CARE_PROVIDER_SITE_OTHER): Payer: Medicare Other

## 2018-01-29 DIAGNOSIS — R002 Palpitations: Secondary | ICD-10-CM | POA: Diagnosis not present

## 2018-01-29 NOTE — Telephone Encounter (Signed)
Order placed for Zio for palpitations, out of 24 hour monitors.

## 2018-02-09 DIAGNOSIS — R002 Palpitations: Secondary | ICD-10-CM | POA: Diagnosis not present

## 2018-02-24 ENCOUNTER — Ambulatory Visit: Payer: Medicare Other | Admitting: Cardiology

## 2018-02-24 ENCOUNTER — Encounter: Payer: Self-pay | Admitting: Cardiology

## 2018-02-24 DIAGNOSIS — R002 Palpitations: Secondary | ICD-10-CM

## 2018-02-24 DIAGNOSIS — R030 Elevated blood-pressure reading, without diagnosis of hypertension: Secondary | ICD-10-CM | POA: Diagnosis not present

## 2018-02-24 DIAGNOSIS — E785 Hyperlipidemia, unspecified: Secondary | ICD-10-CM | POA: Diagnosis not present

## 2018-02-24 DIAGNOSIS — F439 Reaction to severe stress, unspecified: Secondary | ICD-10-CM | POA: Diagnosis not present

## 2018-02-24 MED ORDER — METOPROLOL TARTRATE 25 MG PO TABS: 12.5000 mg | ORAL_TABLET | Freq: Two times a day (BID) | ORAL | 8 refills | Status: DC

## 2018-02-24 NOTE — Assessment & Plan Note (Signed)
PSVT- 11 beats documented on Holter

## 2018-02-24 NOTE — Patient Instructions (Signed)
Medication Instructions:  INCREASE Lopressor to 12.5mg  Take 1 tablet twice a day If you need a refill on your cardiac medications before your next appointment, please call your pharmacy.   Lab work: None  If you have labs (blood work) drawn today and your tests are completely normal, you will receive your results only by: Marland Kitchen MyChart Message (if you have MyChart) OR . A paper copy in the mail If you have any lab test that is abnormal or we need to change your treatment, we will call you to review the results.  Testing/Procedures: None   Follow-Up: At [facility], you and your health needs are our priority.  As part of our continuing mission to provide you with exceptional heart care, we have created designated Provider Care Teams.  These Care Teams include your primary Cardiologist (physician) and Advanced Practice Providers (APPs -  Physician Assistants and Nurse Practitioners) who all work together to provide you with the care you need, when you need it. . Your physician recommends that you schedule a follow up ON A AS NEEDED BASIS.  Any Other Special Instructions Will Be Listed Below (If Applicable).

## 2018-02-24 NOTE — Progress Notes (Signed)
02/24/2018 Debra Frederick   [birth year]  [id number]  Primary Physician Wendie Agreste, MD Primary Cardiologist: Dr Oval Linsey  HPI: The patient is a pleasant 72 year old female who was seen by Dr. Oval Linsey 01/22/2018 for palpitations.  She has no history of coronary disease MI diabetes or documented hypertension.  A ZIO monitor was obtained.  She did have 11 beats of PSVT and ectopy.  She is already on a beta-blocker.  She is in the office today for follow-up.  She tells me she did know that she had this episode of tachycardia.  She tells me that her husband was hospitalized and apparently was critically ill.  The tachycardia happened during this time.  He is now stabilized and she says she has not had any recurrent palpitations or tachycardia.  She denies any unusual chest pain or shortness of breath.  She goes to the gym 5 days a week, she does yoga as well as cardio and upper and lower body weight training.  She only drinks occasional caffeine and does not use over-the-counter decongestants or other stimulants.   Current Outpatient Medications  Medication Sig Dispense Refill  . Ascorbic Acid (VITAMIN C) 1000 MG tablet Take 1,000 mg by mouth daily.    . Calcium-Magnesium-Vitamin D (CALCIUM 500 PO) Take 650 mg by mouth daily.    . Cholecalciferol (VITAMIN D3) 125 MCG (5000 UT) CAPS Take 5,000 Units by mouth daily.    . fish oil-omega-3 fatty acids 1000 MG capsule Take 1 g by mouth daily. Reported on 09/22/2015    . Flaxseed, Linseed, (FLAX SEED OIL PO) Take 1,000 mg by mouth daily.     . metoprolol tartrate (LOPRESSOR) 25 MG tablet Take 0.5 tablets (12.5 mg total) by mouth 2 (two) times daily. (Patient taking differently: Take 12.5 mg by mouth daily. ) 30 tablet 2   No current facility-administered medications for this visit.     Allergies  Allergen Reactions  . Codeine Other (See Comments)    Jittery  . Penicillins Rash    Past Medical History:  Diagnosis Date  . Bursitis   .  Cancer ([place])    basal cell CA x2- thigh and left chest-90's  . Cataract   . History of shingles 2010  . Osteopenia 10/2017   T score -2.2 FRAX 14% / 3.6%    Social History   Socioeconomic History  . Marital status: Married    Spouse name: Not on file  . Number of children: Not on file  . Years of education: Not on file  . Highest education level: Not on file  Occupational History  . Not on file  Social Needs  . Financial resource strain: Not on file  . Food insecurity:    Worry: Not on file    Inability: Not on file  . Transportation needs:    Medical: Not on file    Non-medical: Not on file  Tobacco Use  . Smoking status: Never Smoker  . Smokeless tobacco: Never Used  Substance and Sexual Activity  . Alcohol use: Yes    Alcohol/week: 0.0 standard drinks    Comment: Rare  . Drug use: No  . Sexual activity: Not Currently    Birth control/protection: Surgical, Post-menopausal    Comment: BTL-1st intercourse 12 yo-1 partner  Lifestyle  . Physical activity:    Days per week: Not on file    Minutes per session: Not on file  . Stress: Not on file  Relationships  . Social  connections:    Talks on phone: Not on file    Gets together: Not on file    Attends religious service: Not on file    Active member of club or organization: Not on file    Attends meetings of clubs or organizations: Not on file    Relationship status: Not on file  . Intimate partner violence:    Fear of current or ex partner: Not on file    Emotionally abused: Not on file    Physically abused: Not on file    Forced sexual activity: Not on file  Other Topics Concern  . Not on file  Social History Narrative  . Not on file     Family History  Problem Relation Age of Onset  . Breast cancer Mother        Age 52  . Liver cancer Father   . CAD Father   . Stroke Maternal Grandmother   . Colon polyps Neg Hx   . Esophageal cancer Neg Hx   . Rectal cancer Neg Hx   . Stomach cancer Neg Hx       Review of Systems: General: negative for chills, fever, night sweats or weight changes.  Cardiovascular: negative for chest pain, dyspnea on exertion, edema, orthopnea, palpitations, paroxysmal nocturnal dyspnea or shortness of breath Dermatological: negative for rash Respiratory: negative for cough or wheezing Urologic: negative for hematuria Abdominal: negative for nausea, vomiting, diarrhea, bright red blood per rectum, melena, or hematemesis Neurologic: negative for visual changes, syncope, or dizziness All other systems reviewed and are otherwise negative except as noted above.    Blood pressure 140/84, pulse 90, height 5\' 7"  (1.702 m), weight 153 lb (69.4 kg).  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 Nielson increase her metoprolol to 12.5 mg twice daily.  I will asked Dr. Oval Linsey to review her monitor strips.  I doubt that 11 beats of irregular tachycardia warrants anticoagulation but I wonder if she would be a candidate for an aspirin 81 mg daily.  Kerin Ransom PA-C 02/24/2018 2:53 PM

## 2018-02-24 NOTE — Assessment & Plan Note (Signed)
DL 127- diet- recheck in 3-6 months

## 2018-03-12 DIAGNOSIS — H35341 Macular cyst, hole, or pseudohole, right eye: Secondary | ICD-10-CM | POA: Diagnosis not present

## 2018-03-27 ENCOUNTER — Other Ambulatory Visit: Payer: Self-pay

## 2018-03-27 DIAGNOSIS — R030 Elevated blood-pressure reading, without diagnosis of hypertension: Secondary | ICD-10-CM

## 2018-03-27 DIAGNOSIS — F439 Reaction to severe stress, unspecified: Secondary | ICD-10-CM

## 2018-03-27 DIAGNOSIS — R002 Palpitations: Secondary | ICD-10-CM

## 2018-03-27 MED ORDER — METOPROLOL TARTRATE 25 MG PO TABS: 12.5000 mg | ORAL_TABLET | Freq: Two times a day (BID) | ORAL | 0 refills | Status: DC

## 2018-07-11 ENCOUNTER — Telehealth: Payer: Medicare Other | Admitting: Nurse Practitioner

## 2018-07-11 DIAGNOSIS — N3000 Acute cystitis without hematuria: Secondary | ICD-10-CM | POA: Diagnosis not present

## 2018-07-11 MED ORDER — SULFAMETHOXAZOLE-TRIMETHOPRIM 800-160 MG PO TABS: 1.0000 | ORAL_TABLET | Freq: Two times a day (BID) | ORAL | 0 refills | Status: DC

## 2018-07-11 NOTE — Progress Notes (Signed)
We are sorry that you are not feeling well.  Here is how we plan to help!  Based on what you shared with me it looks like you most likely have a simple urinary tract infection.  A UTI (Urinary Tract Infection) is a bacterial infection of the bladder.  Most cases of urinary tract infections are simple to treat but a key part of your care is to encourage you to drink plenty of fluids and watch your symptoms carefully.  I have prescribed Bactrim DS One tablet twice a day for 5 days.  Your symptoms should gradually improve. Call us if the burning in your urine worsens, you develop worsening fever, back pain or pelvic pain or if your symptoms do not resolve after completing the antibiotic.  Urinary tract infections can be prevented by drinking plenty of water to keep your body hydrated.  Also be sure when you wipe, wipe from front to back and don't hold it in!  If possible, empty your bladder every 4 hours.  Your e-visit answers were reviewed by a board certified advanced clinical practitioner to complete your personal care plan.  Depending on the condition, your plan could have included both over the counter or prescription medications.  If there is a problem please reply  once you have received a response from your provider.  Your safety is important to [place].  If you have drug allergies check your prescription carefully.    You can use MyChart to ask questions about today's visit, request a non-urgent call back, or ask for a work or school excuse for 24 hours related to this e-Visit. If it has been greater than 24 hours you will need to follow up with your provider, or enter a new e-Visit to address those concerns.   You will get an e-mail in the next two days asking about your experience.  I hope that your e-visit has been valuable and will speed your recovery. Thank you for using e-visits.   5 minutes spent reviewing and documenting in chart.

## 2018-07-28 ENCOUNTER — Other Ambulatory Visit: Payer: Self-pay | Admitting: Cardiovascular Disease

## 2018-07-28 DIAGNOSIS — F439 Reaction to severe stress, unspecified: Secondary | ICD-10-CM

## 2018-07-28 DIAGNOSIS — R002 Palpitations: Secondary | ICD-10-CM

## 2018-07-28 DIAGNOSIS — R030 Elevated blood-pressure reading, without diagnosis of hypertension: Secondary | ICD-10-CM

## 2018-07-28 NOTE — Telephone Encounter (Signed)
Lopressor 25 mg refilled 

## 2018-10-06 DIAGNOSIS — H35371 Puckering of macula, right eye: Secondary | ICD-10-CM | POA: Diagnosis not present

## 2018-10-06 DIAGNOSIS — Z961 Presence of intraocular lens: Secondary | ICD-10-CM | POA: Diagnosis not present

## 2018-10-06 DIAGNOSIS — H5213 Myopia, bilateral: Secondary | ICD-10-CM | POA: Diagnosis not present

## 2018-10-12 DIAGNOSIS — L570 Actinic keratosis: Secondary | ICD-10-CM | POA: Diagnosis not present

## 2018-10-12 DIAGNOSIS — D224 Melanocytic nevi of scalp and neck: Secondary | ICD-10-CM | POA: Diagnosis not present

## 2018-10-12 DIAGNOSIS — Z85828 Personal history of other malignant neoplasm of skin: Secondary | ICD-10-CM | POA: Diagnosis not present

## 2018-10-12 DIAGNOSIS — D225 Melanocytic nevi of trunk: Secondary | ICD-10-CM | POA: Diagnosis not present

## 2018-10-12 DIAGNOSIS — L821 Other seborrheic keratosis: Secondary | ICD-10-CM | POA: Diagnosis not present

## 2018-11-26 ENCOUNTER — Other Ambulatory Visit: Payer: Self-pay | Admitting: Cardiovascular Disease

## 2018-11-26 DIAGNOSIS — F439 Reaction to severe stress, unspecified: Secondary | ICD-10-CM

## 2018-11-26 DIAGNOSIS — R002 Palpitations: Secondary | ICD-10-CM

## 2018-11-26 DIAGNOSIS — R030 Elevated blood-pressure reading, without diagnosis of hypertension: Secondary | ICD-10-CM

## 2019-01-04 ENCOUNTER — Other Ambulatory Visit: Payer: Self-pay | Admitting: Family Medicine

## 2019-01-06 NOTE — Telephone Encounter (Signed)
Please schedule patient an appointment .  Last time this was refilled was 01/17/2018 only for 30 tabs.  Last time seen was 12-2017.

## 2019-01-07 ENCOUNTER — Encounter: Payer: Self-pay | Admitting: Gynecology

## 2019-01-08 NOTE — Telephone Encounter (Signed)
Spoke with pt and she states that she does not take hydroxyzine and did not ask for it. Refused appt

## 2019-04-20 ENCOUNTER — Ambulatory Visit: Payer: Medicare Other | Attending: Internal Medicine

## 2019-04-20 DIAGNOSIS — Z23 Encounter for immunization: Secondary | ICD-10-CM | POA: Insufficient documentation

## 2019-04-20 NOTE — Progress Notes (Signed)
   Covid-19 Vaccination Clinic  Name:  AKIRRA NOVACK    MRN: [id number] DOB: 07-Jan-1946  04/20/2019  Ms. Ralston was observed post Covid-19 immunization for 15 minutes without incidence. She was provided with Vaccine Information Sheet and instruction to access the V-Safe system.   Ms. Hadfield was instructed to call 911 with any severe reactions post vaccine: Marland Kitchen Difficulty breathing  . Swelling of your face and throat  . A fast heartbeat  . A bad rash all over your body  . Dizziness and weakness    Immunizations Administered    Name Date Dose VIS Date Route   Pfizer COVID-19 Vaccine 04/20/2019 11:53 AM 0.3 mL 03/12/2019 Intramuscular   Manufacturer: [place]   Lot: [id number]   [place]: [id number]

## 2019-05-11 ENCOUNTER — Ambulatory Visit: Payer: Medicare Other | Attending: Internal Medicine

## 2019-05-11 DIAGNOSIS — Z23 Encounter for immunization: Secondary | ICD-10-CM | POA: Insufficient documentation

## 2019-05-11 NOTE — Progress Notes (Signed)
   Covid-19 Vaccination Clinic  Name:  Debra Frederick    MRN: [id number] DOB: Aug 05, 1945  05/11/2019  [name] was observed post Covid-19 immunization for 15 minutes without incidence. She was provided with Vaccine Information Sheet and instruction to access the V-Safe system.   [name] was instructed to call 911 with any severe reactions post vaccine: Marland Kitchen Difficulty breathing  . Swelling of your face and throat  . A fast heartbeat  . A bad rash all over your body  . Dizziness and weakness    Immunizations Administered    Name Date Dose VIS Date Route   Pfizer COVID-19 Vaccine 05/11/2019 12:23 PM 0.3 mL 03/12/2019 Intramuscular   Manufacturer: [place]   Lot: [id number]   [place]: [id number]

## 2019-07-07 ENCOUNTER — Other Ambulatory Visit: Payer: Self-pay | Admitting: Cardiovascular Disease

## 2019-07-07 DIAGNOSIS — R030 Elevated blood-pressure reading, without diagnosis of hypertension: Secondary | ICD-10-CM

## 2019-07-07 DIAGNOSIS — F439 Reaction to severe stress, unspecified: Secondary | ICD-10-CM

## 2019-07-07 DIAGNOSIS — R002 Palpitations: Secondary | ICD-10-CM

## 2019-07-08 MED ORDER — METOPROLOL TARTRATE 25 MG PO TABS: 12.5000 mg | ORAL_TABLET | Freq: Two times a day (BID) | ORAL | 0 refills | Status: DC

## 2019-07-08 NOTE — Addendum Note (Signed)
Addended by: Jones Broom on: 07/08/2019 02:09 PM   Modules accepted: Orders

## 2019-09-15 ENCOUNTER — Encounter: Payer: Self-pay | Admitting: Obstetrics and Gynecology

## 2019-09-15 DIAGNOSIS — Z1231 Encounter for screening mammogram for malignant neoplasm of breast: Secondary | ICD-10-CM | POA: Diagnosis not present

## 2019-10-08 DIAGNOSIS — H5213 Myopia, bilateral: Secondary | ICD-10-CM | POA: Diagnosis not present

## 2019-10-08 DIAGNOSIS — Z961 Presence of intraocular lens: Secondary | ICD-10-CM | POA: Diagnosis not present

## 2019-10-08 DIAGNOSIS — H35371 Puckering of macula, right eye: Secondary | ICD-10-CM | POA: Diagnosis not present

## 2019-10-12 DIAGNOSIS — L821 Other seborrheic keratosis: Secondary | ICD-10-CM | POA: Diagnosis not present

## 2019-10-12 DIAGNOSIS — D224 Melanocytic nevi of scalp and neck: Secondary | ICD-10-CM | POA: Diagnosis not present

## 2019-10-12 DIAGNOSIS — D225 Melanocytic nevi of trunk: Secondary | ICD-10-CM | POA: Diagnosis not present

## 2019-10-12 DIAGNOSIS — Z85828 Personal history of other malignant neoplasm of skin: Secondary | ICD-10-CM | POA: Diagnosis not present

## 2019-10-12 DIAGNOSIS — M720 Palmar fascial fibromatosis [Dupuytren]: Secondary | ICD-10-CM | POA: Diagnosis not present

## 2019-11-10 DIAGNOSIS — Z20822 Contact with and (suspected) exposure to covid-19: Secondary | ICD-10-CM | POA: Diagnosis not present

## 2020-07-12 ENCOUNTER — Ambulatory Visit: Payer: Medicare Other | Admitting: Obstetrics & Gynecology

## 2020-07-12 ENCOUNTER — Other Ambulatory Visit: Payer: Self-pay

## 2020-07-12 ENCOUNTER — Encounter: Payer: Self-pay | Admitting: Obstetrics & Gynecology

## 2020-07-12 VITALS — BP 140/90 | Ht 67.0 in | Wt 164.0 lb

## 2020-07-12 DIAGNOSIS — M8589 Other specified disorders of bone density and structure, multiple sites: Secondary | ICD-10-CM | POA: Diagnosis not present

## 2020-07-12 DIAGNOSIS — Z780 Asymptomatic menopausal state: Secondary | ICD-10-CM

## 2020-07-12 DIAGNOSIS — N904 Leukoplakia of vulva: Secondary | ICD-10-CM

## 2020-07-12 DIAGNOSIS — Z01419 Encounter for gynecological examination (general) (routine) without abnormal findings: Secondary | ICD-10-CM | POA: Diagnosis not present

## 2020-07-12 MED ORDER — CLOBETASOL PROPIONATE 0.05 % EX CREA: 1.0000 "application " | TOPICAL_CREAM | Freq: Every day | CUTANEOUS | 4 refills | Status: AC

## 2020-07-12 NOTE — Progress Notes (Signed)
Debra Frederick [birth year] [id number]  History:    75 y.o. G3P2A1L2 Widowed  RP:  Established patient for Annual Gyn exam  HPI:  Postmenopausal/atrophic genital changes.  No significant menopausal symptoms or any vaginal bleeding.  No pelvic pain.  Abstinent.  Pap neg 2018.  Urine/BMs normal.  Breasts normal.  Osteopenia. DEXA 2019 T score -2.2 FRAX 14% / 3.6%.  The patient exercises on a regular basis.  On Vit D/Ca++.  Colonoscopy 2015.  Health labs with Fam MD.  Past medical history,surgical history, family history and social history were all reviewed and documented in the EPIC chart.  Gynecologic History No LMP recorded. Patient is postmenopausal.  Obstetric History OB History  Gravida Para Term Preterm AB Living  3 2 2   1 2   SAB IAB Ectopic Multiple Live Births               # Outcome Date GA Lbr Len/2nd Weight Sex Delivery Anes PTL Lv  3 AB           2 Term           1 Term              ROS: A ROS was performed and pertinent positives and negatives are included in the history.  GENERAL: No fevers or chills. HEENT: No change in vision, no earache, sore throat or sinus congestion. NECK: No pain or stiffness. CARDIOVASCULAR: No chest pain or pressure. No palpitations. PULMONARY: No shortness of breath, cough or wheeze. GASTROINTESTINAL: No abdominal pain, nausea, vomiting or diarrhea, melena or bright red blood per rectum. GENITOURINARY: No urinary frequency, urgency, hesitancy or dysuria. MUSCULOSKELETAL: No joint or muscle pain, no back pain, no recent trauma. DERMATOLOGIC: No rash, no itching, no lesions. ENDOCRINE: No polyuria, polydipsia, no heat or cold intolerance. No recent change in weight. HEMATOLOGICAL: No anemia or easy bruising or bleeding. NEUROLOGIC: No headache, seizures, numbness, tingling or weakness. PSYCHIATRIC: No depression, no loss of interest in normal activity or change in sleep pattern.     Exam:   BP 140/90   Ht 5\' 7"  (1.702 m)   Wt 164 lb (74.4  kg)   BMI 25.69 kg/m   Body mass index is 25.69 kg/m.  General appearance : Well developed well nourished female. No acute distress HEENT: Eyes: no retinal hemorrhage or exudates,  Neck supple, trachea midline, no carotid bruits, no thyroidmegaly Lungs: Clear to auscultation, no rhonchi or wheezes, or rib retractions  Heart: Regular rate and rhythm, no murmurs or gallops Breast:Examined in sitting and supine position were symmetrical in appearance, no palpable masses or tenderness,  no skin retraction, no nipple inversion, no nipple discharge, no skin discoloration, no axillary or supraclavicular lymphadenopathy Abdomen: no palpable masses or tenderness, no rebound or guarding Extremities: no edema or skin discoloration or tenderness  Pelvic: Vulva: Normal             Vagina: No gross lesions or discharge  Cervix: No gross lesions or discharge  Uterus  AV, normal size, shape and consistency, non-tender and mobile  Adnexa  Without masses or tenderness  Anus: Normal   Assessment/Plan:  75 y.o. female for annual exam   1. Well female exam with routine gynecological exam Normal gynecologic exam in menopause, except for lichen sclerosus of the vulva.  No indication for a Pap test at this time.  Breast exam normal.  Screening mammogram June 2021 was negative.  Colonoscopy 2015.  Health labs with family  physician.  Body mass index 25.69.  2. Postmenopause Well on no systemic hormone replacement therapy.  No postmenopausal bleeding.  3. Osteopenia of multiple sites Schedule [facility] at [facility].  4. Lichen sclerosus et atrophicus of the vulva Needs to use the clobetasol cream daily for 14 days and then at least twice a week.  Represcription sent to pharmacy.  Other orders - clobetasol cream (TEMOVATE) 0.05 %; Apply 1 application topically daily. Daily for 14 days, then twice a week.  Princess Bruins MD, 2:10 PM 07/12/2020

## 2020-09-22 ENCOUNTER — Encounter: Payer: Self-pay | Admitting: Anesthesiology

## 2020-11-24 NOTE — Progress Notes (Signed)
Cardiology Office Note   Date:  11/27/2020   ID:  Debra, Frederick 05-14-1945, MRN [id number]  PCP:  Sueanne Margarita, DO  Cardiologist:   Skeet Latch, MD   No chief complaint on file.    History of Present Illness: Debra Frederick is a 75 y.o. female with a hx of hypertension, hyperlipidemia, and palpitations who is being seen today for follow-up. I initially saw her 01/22/2018 for the evaluation of palpitations at the request of Sueanne Margarita, DO.   At her last appointment, Ms.Conrath had reported increasing palpitations. In the past they happened only once every few months, but this became a daily occurrence. Her PCP started her on metoprolol. She noted no changes in her palpitations, but did feel more relaxed. She wore a 24 hour Holter monitor which showed an 11 beat run of SVT and occasional PACs and PVCs. Her metoprolol was increased. She followed up with Debra Ransom, PA-C 01/2018.  Today, she is feeling alright overall. She continues to have occasional palpitations, but "nothing like she used to have." Metoprolol was not very effective for her. She presents a blood pressure log which shows an average of 110s-120s. She is concerned that it seems low while she is sitting and elevated with exertion. However, she remains asymptomatic during her higher and lower readings. For exercise, she will sometimes go to the gym at the [facility], but she notes this is not enough. She is also working a part time job 3-4 hours, 4 days a week. For her job she often walks down a hall and carries boxes. Also she regularly uses stairs in her 2 story home. While walking, she has some pain in her lower extremities that she attributes to bursitis. For her diet she is mostly cooking at home, but recognizes her diet may not be the best. She denies any chest pain, or shortness of breath. No lightheadedness, headaches, syncope, orthopnea, or PND. Also has no lower extremity edema.   Past Medical  History:  Diagnosis Date   Bursitis    Cancer ([place])    basal cell CA x2- thigh and left chest-90's   Cataract    History of shingles 2010   Osteopenia 10/2017   T score -2.2 FRAX 14% / 3.6%    Past Surgical History:  Procedure Laterality Date   basal cell excised     BREAST LUMPECTOMY     Benign   CATARACT EXTRACTION     COLONOSCOPY     DILATION AND CURETTAGE OF UTERUS     macular eye surg     TUBAL LIGATION      Current Outpatient Medications  Medication Sig Dispense Refill   Ascorbic Acid (VITAMIN C) 1000 MG tablet Take 1,000 mg by mouth daily.     ascorbic acid (VITAMIN C) 1000 MG tablet 1 tablet     calcium carbonate (OSCAL) 1500 (600 Ca) MG TABS tablet 1 tablet with meals     Calcium-Magnesium-Vitamin D (CALCIUM 500 PO) Take 650 mg by mouth daily.     Cholecalciferol (VITAMIN D3) 125 MCG (5000 UT) CAPS Take 5,000 Units by mouth daily.     Cholecalciferol 125 MCG (5000 UT) capsule 1 capsule     clobetasol cream (TEMOVATE) [id number] % Apply 1 application topically daily. Daily for 14 days, then twice a week. 30 g 4   famotidine (PEPCID) 10 MG tablet 1 tablet as needed     fish oil-omega-3 fatty acids 1000 MG capsule Take 1  g by mouth daily. Reported on 09/22/2015     Flaxseed, Linseed, (FLAX SEED OIL PO) Take 1,000 mg by mouth daily.      sertraline (ZOLOFT) 50 MG tablet TAKE 1/2 TABLET EVERY DAY FOR TWO WEEKS AND AS TOLERATED INCREASE TO FULL TABLET.     No current facility-administered medications for this visit.    Allergies:   Codeine and Penicillins    Social History:  The patient  reports that she has never smoked. She has never used smokeless tobacco. She reports current alcohol use. She reports that she does not use drugs.   Family History:  The patient's family history includes Breast cancer in her mother; CAD in her father; Liver cancer in her father; Stroke in her maternal grandmother.    ROS:   Please see the history of present illness. (+) Palpitations (+)  LE pain All other systems are reviewed and negative.    PHYSICAL EXAM: VS:  BP 132/84 (BP Location: Left Arm, Patient Position: Sitting)   Pulse 82   Ht 5' 7.5" (1.715 m)   Wt 158 lb 12.8 oz (72 kg)   SpO2 96%   BMI 24.50 kg/m  , BMI Body mass index is 24.5 kg/m. GENERAL:  Well appearing.  Anxious. HEENT:  Pupils equal round and reactive, fundi not visualized, oral mucosa unremarkable NECK:  No jugular venous distention, waveform within normal limits, carotid upstroke brisk and symmetric, no bruits, no thyromegaly LYMPHATICS:  No cervical adenopathy LUNGS:  Clear to auscultation bilaterally HEART:  RRR.  PMI not displaced or sustained,S1 and S2 within normal limits, no S3, no S4, no clicks, no rubs, no murmurs ABD:  Flat, positive bowel sounds normal in frequency in pitch, no bruits, no rebound, no guarding, no midline pulsatile mass, no hepatomegaly, no splenomegaly EXT:  2 plus pulses throughout, no edema, no cyanosis no clubbing SKIN:  No rashes no nodules NEURO:  Cranial nerves II through XII grossly intact, motor grossly intact throughout PSYCH:  Cognitively intact, oriented to person place and time   EKG:   11/27/2020: Sinus Rhythm. Rate 82 bpm. 01/22/2018: EKG was not ordered.   Recent Labs: No results found for requested labs within last 8760 hours.    Lipid Panel    Component Value Date/Time   CHOL 221 (H) 11/01/2017 1427   TRIG 91 11/01/2017 1427   HDL 76 11/01/2017 1427   CHOLHDL 2.9 11/01/2017 1427   CHOLHDL 2.8 10/19/2015 0852   VLDL 21 10/19/2015 0852   LDLCALC 127 (H) 11/01/2017 1427      Wt Readings from Last 3 Encounters:  11/27/20 158 lb 12.8 oz (72 kg)  07/12/20 164 lb (74.4 kg)  02/24/18 153 lb (69.4 kg)      ASSESSMENT AND PLAN: Palpitations Much better controlled as she has been treated for anxiety.  Metoprolol wasn't helpful.   Elevated BP without diagnosis of hypertension Blood pressure has been well-controlled at home but is high in  the office.  I suspect that she has some whitecoat hypertension.  We did discuss sitting and resting about 5 minutes before checking her blood pressures.  She will bring her cuff to her next appointment to make sure it is accurate.  No medication indicated for now.  Dyslipidemia Lipids have been poorly controlled.  The last numbers were from 2019.  She does have atherosclerosis of the aorta.  She is hesitant to take statins.  We discussed the fact that although many people have myalgias, they always go  away when the statin is discontinued.  She is interested in getting a coronary calcium score to better understand her cardiovascular risk.  Based on this she will decide if she is willing to take a statin.  For now, she is going to work on increasing her exercise and watching her diet.  She should be getting at least 150 minutes of exercise weekly.   Current medicines are reviewed at length with the patient today.  The patient does not have concerns regarding medicines.  The following changes have been made:  no change  Labs/ tests ordered today include:   Orders Placed This Encounter  Procedures   CT CARDIAC SCORING (SELF PAY ONLY)   EKG 12-Lead      Disposition:   FU with Ishmail Mcmanamon C. Oval Linsey, MD, [facility] in 3-4 months.  I,Mathew Stumpf,acting as a Education administrator for Skeet Latch, MD.,have documented all relevant documentation on the behalf of Skeet Latch, MD,as directed by  Skeet Latch, MD while in the presence of Skeet Latch, MD.  I, [place] Oval Linsey, MD have reviewed all documentation for this visit.  The documentation of the exam, diagnosis, procedures, and orders on 11/27/2020 are all accurate and complete.   Signed, Landers Prajapati C. Oval Linsey, MD, [facility]  11/27/2020 11:45 AM    [place]

## 2020-11-27 ENCOUNTER — Encounter (HOSPITAL_BASED_OUTPATIENT_CLINIC_OR_DEPARTMENT_OTHER): Payer: Self-pay | Admitting: Cardiovascular Disease

## 2020-11-27 ENCOUNTER — Other Ambulatory Visit: Payer: Self-pay

## 2020-11-27 ENCOUNTER — Ambulatory Visit (HOSPITAL_BASED_OUTPATIENT_CLINIC_OR_DEPARTMENT_OTHER): Payer: Medicare Other | Admitting: Cardiovascular Disease

## 2020-11-27 DIAGNOSIS — R030 Elevated blood-pressure reading, without diagnosis of hypertension: Secondary | ICD-10-CM

## 2020-11-27 DIAGNOSIS — E785 Hyperlipidemia, unspecified: Secondary | ICD-10-CM

## 2020-11-27 DIAGNOSIS — R002 Palpitations: Secondary | ICD-10-CM

## 2020-11-27 NOTE — Patient Instructions (Addendum)
Medication Instructions:  Your physician recommends that you continue on your current medications as directed. Please refer to the Current Medication list given to you today.   *If you need a refill on your cardiac medications before your next appointment, please call your pharmacy*  Lab Work: [place] FAXED TO DR [facility]   If you have labs (blood work) drawn today and your tests are completely normal, you will receive your results only by: [place] (if you have MyChart) OR A paper copy in the mail If you have any lab test that is abnormal or we need to change your treatment, we will call you to review the results.  Testing/Procedures: CALCIUM SCORE - THIS WILL COST YOU $99 OUT OF POCKET [facility] [place] [address]  Follow-Up: At [facility], you and your health needs are our priority.  As part of our continuing mission to provide you with exceptional heart care, we have created designated Provider Care Teams.  These Care Teams include your primary Cardiologist (physician) and Advanced Practice Providers (APPs -  Physician Assistants and Nurse Practitioners) who all work together to provide you with the care you need, when you need it.  We recommend signing up for the patient portal called "MyChart".  Sign up information is provided on this After Visit Summary.  MyChart is used to connect with patients for Virtual Visits (Telemedicine).  Patients are able to view lab/test results, encounter notes, upcoming appointments, etc.  Non-urgent messages can be sent to your provider as well.   To learn more about what you can do with MyChart, go to [email or web address].    Your next appointment:   3-4 month(s)  The format for your next appointment:   In Person  Provider:   Skeet Latch, MD or Laurann Montana, NP  BRING YOUR BLOOD PRESSURE MACHINE TO YOUR FOLLOW UP

## 2020-11-27 NOTE — Assessment & Plan Note (Signed)
Lipids have been poorly controlled.  The last numbers were from 2019.  She does have atherosclerosis of the aorta.  She is hesitant to take statins.  We discussed the fact that although many people have myalgias, they always go away when the statin is discontinued.  She is interested in getting a coronary calcium score to better understand her cardiovascular risk.  Based on this she will decide if she is willing to take a statin.  For now, she is going to work on increasing her exercise and watching her diet.  She should be getting at least 150 minutes of exercise weekly.

## 2020-11-27 NOTE — Assessment & Plan Note (Signed)
Much better controlled as she has been treated for anxiety.  Metoprolol wasn't helpful.

## 2020-11-27 NOTE — Assessment & Plan Note (Signed)
Blood pressure has been well-controlled at home but is high in the office.  I suspect that she has some whitecoat hypertension.  We did discuss sitting and resting about 5 minutes before checking her blood pressures.  She will bring her cuff to her next appointment to make sure it is accurate.  No medication indicated for now.

## 2020-12-15 ENCOUNTER — Ambulatory Visit (INDEPENDENT_AMBULATORY_CARE_PROVIDER_SITE_OTHER)
Admission: RE | Admit: 2020-12-15 | Discharge: 2020-12-15 | Disposition: A | Discharge status: Home / Self Care | Payer: Self-pay | Source: Ambulatory Visit | Attending: Cardiovascular Disease | Admitting: Cardiovascular Disease

## 2020-12-15 ENCOUNTER — Other Ambulatory Visit: Payer: Self-pay

## 2020-12-15 DIAGNOSIS — R002 Palpitations: Secondary | ICD-10-CM

## 2021-03-01 ENCOUNTER — Ambulatory Visit (HOSPITAL_BASED_OUTPATIENT_CLINIC_OR_DEPARTMENT_OTHER): Payer: Medicare Other | Admitting: Cardiovascular Disease

## 2021-03-01 ENCOUNTER — Encounter (HOSPITAL_BASED_OUTPATIENT_CLINIC_OR_DEPARTMENT_OTHER): Payer: Self-pay | Admitting: Cardiovascular Disease

## 2021-03-01 ENCOUNTER — Other Ambulatory Visit: Payer: Self-pay

## 2021-03-01 DIAGNOSIS — E785 Hyperlipidemia, unspecified: Secondary | ICD-10-CM

## 2021-03-01 DIAGNOSIS — R030 Elevated blood-pressure reading, without diagnosis of hypertension: Secondary | ICD-10-CM | POA: Diagnosis not present

## 2021-03-01 NOTE — Assessment & Plan Note (Signed)
LDL was 121.  She has been hesitant about statins.  She had a coronary calcium score that was 0.  She is not had any chest pain or shortness of breath.  Plan to continue working on diet and exercise.  No medications at this time.

## 2021-03-01 NOTE — Patient Instructions (Signed)
Medication Instructions:  Your physician recommends that you continue on your current medications as directed.   Labwork: none  Testing/Procedures: none  Follow-Up: As needed

## 2021-03-01 NOTE — Progress Notes (Signed)
Cardiology Office Note  Date:  03/01/2021   ID:  Debra Frederick, DOB 08-20-1945, MRN [id number]  PCP:  Sueanne Margarita, DO  Cardiologist:   Skeet Latch, MD   No chief complaint on file.   History of Present Illness: Debra Frederick is a 75 y.o. female with a hx of hypertension, hyperlipidemia, and palpitations who is being seen today for follow-up. I initially saw her 01/22/2018 for the evaluation of palpitations at the request of Sueanne Margarita, DO. Ms.Widrig had reported increasing palpitations. In the past they happened only once every few months, but this became a daily occurrence. Her PCP started her on metoprolol. She noted no changes in her palpitations, but did feel more relaxed. She wore a 24 hour Holter monitor which showed an 11 beat run of SVT and occasional PACs and PVCs. Her metoprolol was increased. She followed up with Kerin Ransom, PA-C 01/2018.  At her last appointment, she continued to have occasional palpitations that were less severe. She felt metoprolol was not very effective. She was concerned with elevated blood pressures with exertion, but she was asymptomatic. Also, she noted some LE pain that she attributed to bursitis. Her blood pressure was elevated at that appointment but had been controlled at home, so she was asked to track it and bring her machine to follow-up. She was hesitant about taking a statin, and had a coronary calcium score 11/2020 which revealed no coronary artery disease. Today, she is feeling fine overall. She has been trying to stay active, including at work. She has been checking her blood pressure at home including during and after activity, but is having difficulty determining which readings she should use. She brought her machine today, which shows stable readings similar to this visit. Also, she presents a record of her recent lab work. Her total cholesterol is 218, and triglycerides 126. Once a week she takes alendronate. However, this has been  causing arthralgias in her hands, severe enough to have difficulty opening the refrigerator door. Her symptoms gradually decrease as she nears the time for the next dose. She is unsure if she wants to continue with this medication. She denies any palpitations, chest pain, or shortness of breath. No lightheadedness, headaches, syncope, orthopnea, PND, lower extremity edema or exertional symptoms.  Past Medical History:  Diagnosis Date   Bursitis    Cancer ([place])    basal cell CA x2- thigh and left chest-90's   Cataract    History of shingles 2010   Osteopenia 10/2017   T score -2.2 FRAX 14% / 3.6%    Past Surgical History:  Procedure Laterality Date   basal cell excised     BREAST LUMPECTOMY     Benign   CATARACT EXTRACTION     COLONOSCOPY     DILATION AND CURETTAGE OF UTERUS     macular eye surg     TUBAL LIGATION      Current Outpatient Medications  Medication Sig Dispense Refill   alendronate (FOSAMAX) 70 MG tablet Take 1 tablet by mouth once a week.     ascorbic acid (VITAMIN C) 1000 MG tablet 1 tablet     calcium carbonate (OSCAL) 1500 (600 Ca) MG TABS tablet 1 tablet with meals     Cholecalciferol (VITAMIN D3) 125 MCG (5000 UT) CAPS Take 5,000 Units by mouth daily.     clobetasol cream (TEMOVATE) 3.81 % Apply 1 application topically daily. Daily for 14 days, then twice a week. 30 g 4  famotidine (PEPCID) 10 MG tablet 1 tablet as needed     fish oil-omega-3 fatty acids 1000 MG capsule Take 1 g by mouth daily. Reported on 09/22/2015     Flaxseed, Linseed, (FLAX SEED OIL PO) Take 1,000 mg by mouth daily.      sertraline (ZOLOFT) 50 MG tablet TAKE 1/2 TABLET EVERY DAY FOR TWO WEEKS AND AS TOLERATED INCREASE TO FULL TABLET.     No current facility-administered medications for this visit.    Allergies:   Codeine and Penicillins    Social History:  The patient  reports that she has never smoked. She has never used smokeless tobacco. She reports current alcohol use. She reports  that she does not use drugs.   Family History:  The patient's family history includes Breast cancer in her mother; CAD in her father; Liver cancer in her father; Stroke in her maternal grandmother.    ROS:   Please see the history of present illness. (+) Arthralgias in bilateral hands All other systems are reviewed and negative.    PHYSICAL EXAM: VS:  BP 130/88 (BP Location: Left Arm, Patient Position: Sitting, Cuff Size: Normal)   Pulse 84   Ht 5' 7.5" (1.715 m)   Wt 163 lb 11.2 oz (74.3 kg)   SpO2 99%   BMI 25.26 kg/m  , BMI Body mass index is 25.26 kg/m. GENERAL:  Well appearing.  No acute distress. HEENT:  Pupils equal round and reactive, fundi not visualized, oral mucosa unremarkable NECK:  No jugular venous distention, waveform within normal limits, carotid upstroke brisk and symmetric, no bruits, no thyromegaly LUNGS:  Clear to auscultation bilaterally HEART:  RRR.  PMI not displaced or sustained,S1 and S2 within normal limits, no S3, no S4, no clicks, no rubs, no murmurs ABD:  Flat, positive bowel sounds normal in frequency in pitch, no bruits, no rebound, no guarding, no midline pulsatile mass, no hepatomegaly, no splenomegaly EXT:  2 plus pulses throughout, no edema, no cyanosis no clubbing SKIN:  No rashes no nodules NEURO:  Cranial nerves II through XII grossly intact, motor grossly intact throughout PSYCH:  Cognitively intact, oriented to person place and time  Calcium Score 12/15/2020: IMPRESSION: Coronary calcium score of 0. This was 0 percentile for age-, race-, and sex-matched controls.  Monitor 01/29/2018: 72 Hour Event Monitor   Quality: Fair.  Baseline artifact. Predominant rhythm: sinus rhythm Average heart rate: 73 bpm Max heart rate: 122 bpm Min heart rate: 46 bpm   11 beat run of SVT Occasional PACs and rare PVCs  EKG:   03/01/2021: EKG was not ordered. 11/27/2020: Sinus Rhythm. Rate 82 bpm. 01/22/2018: EKG was not ordered.  Recent Labs: No  results found for requested labs within last 8760 hours.   Lipid Panel    Component Value Date/Time   CHOL 221 (H) 11/01/2017 1427   TRIG 91 11/01/2017 1427   HDL 76 11/01/2017 1427   CHOLHDL 2.9 11/01/2017 1427   CHOLHDL 2.8 10/19/2015 0852   VLDL 21 10/19/2015 0852   LDLCALC 127 (H) 11/01/2017 1427    Wt Readings from Last 3 Encounters:  03/01/21 163 lb 11.2 oz (74.3 kg)  11/27/20 158 lb 12.8 oz (72 kg)  07/12/20 164 lb (74.4 kg)    ASSESSMENT AND PLAN:  Dyslipidemia LDL was 121.  She has been hesitant about statins.  She had a coronary calcium score that was 0.  She is not had any chest pain or shortness of breath.  Plan to continue  working on diet and exercise.  No medications at this time.  Elevated BP without diagnosis of hypertension Blood pressures have been mostly less than 140/90 at home.  She does not have any cardiovascular risk factors.  Therefore this is acceptable.  Continue with diet and exercise.   Current medicines are reviewed at length with the patient today.  The patient does not have concerns regarding medicines.  The following changes have been made:  no change  Labs/ tests ordered today include:   No orders of the defined types were placed in this encounter.   Disposition:   FU with Jameela Michna C. Oval Linsey, MD, [facility]  PRN.  I,Mathew Stumpf,acting as a Education administrator for Skeet Latch, MD.,have documented all relevant documentation on the behalf of Skeet Latch, MD,as directed by  Skeet Latch, MD while in the presence of Skeet Latch, MD.  I, [place] Oval Linsey, MD have reviewed all documentation for this visit.  The documentation of the exam, diagnosis, procedures, and orders on 03/01/2021 are all accurate and complete.  Signed, Ezekiah Massie C. Oval Linsey, MD, [facility]  03/01/2021 1:41 PM    [place]

## 2021-03-01 NOTE — Assessment & Plan Note (Signed)
Blood pressures have been mostly less than 140/90 at home.  She does not have any cardiovascular risk factors.  Therefore this is acceptable.  Continue with diet and exercise.

## 2022-01-15 ENCOUNTER — Other Ambulatory Visit: Payer: Self-pay | Admitting: Obstetrics & Gynecology

## 2022-01-15 NOTE — Telephone Encounter (Signed)
Last annual exam was 06/2020 No exam scheduled.

## 2022-01-16 MED ORDER — CLOBETASOL PROPIONATE 0.05 % EX CREA: 1.0000 | TOPICAL_CREAM | Freq: Two times a day (BID) | CUTANEOUS | 0 refills | Status: DC

## 2022-01-16 NOTE — Addendum Note (Signed)
Addended by: Thamas Jaegers on: 01/16/2022 11:59 AM   Modules accepted: Orders

## 2022-01-16 NOTE — Telephone Encounter (Signed)
Patient scheduled 02/04/22 for annual exam   Dr.Lavoie replied "Schedule Annual exam.  Send prescription of Clobetasol with no refill"

## 2022-01-16 NOTE — Telephone Encounter (Signed)
Sent message to appointments to schedule.

## 2022-02-04 ENCOUNTER — Ambulatory Visit: Payer: Medicare Other | Admitting: Obstetrics & Gynecology

## 2022-02-05 ENCOUNTER — Encounter: Payer: Self-pay | Admitting: Obstetrics & Gynecology

## 2022-07-18 ENCOUNTER — Ambulatory Visit (INDEPENDENT_AMBULATORY_CARE_PROVIDER_SITE_OTHER): Payer: Medicare Other | Admitting: Obstetrics & Gynecology

## 2022-07-18 ENCOUNTER — Encounter: Payer: Self-pay | Admitting: Obstetrics & Gynecology

## 2022-07-18 ENCOUNTER — Other Ambulatory Visit (HOSPITAL_COMMUNITY)
Admission: RE | Admit: 2022-07-18 | Discharge: 2022-07-18 | Disposition: A | Discharge status: Home / Self Care | Payer: Medicare Other | Source: Ambulatory Visit | Attending: Obstetrics & Gynecology | Admitting: Obstetrics & Gynecology

## 2022-07-18 VITALS — BP 116/64 | HR 88 | Ht 67.25 in | Wt 166.0 lb

## 2022-07-18 DIAGNOSIS — Z01419 Encounter for gynecological examination (general) (routine) without abnormal findings: Secondary | ICD-10-CM

## 2022-07-18 DIAGNOSIS — M8589 Other specified disorders of bone density and structure, multiple sites: Secondary | ICD-10-CM

## 2022-07-18 DIAGNOSIS — N904 Leukoplakia of vulva: Secondary | ICD-10-CM | POA: Diagnosis not present

## 2022-07-18 DIAGNOSIS — Z780 Asymptomatic menopausal state: Secondary | ICD-10-CM

## 2022-07-18 MED ORDER — CLOBETASOL PROPIONATE 0.05 % EX CREA: 1.0000 | TOPICAL_CREAM | CUTANEOUS | 5 refills | Status: AC

## 2022-07-18 NOTE — Progress Notes (Signed)
Debra Frederick 23-Jul-1945 [id number]   History:    77 y.o. G3P2A1L2 Widowed   RP:  Established patient for Annual Gyn exam   HPI:  Postmenopausal/atrophic genital changes.  No significant menopausal symptoms or any vaginal bleeding.  No pelvic pain. Lichen Sclerosis with vulvar itching controled on Clobetasol cream twice a week. Abstinent.  Pap neg 2018.  Pap reflex today.  Urine/BMs normal.  Breasts normal. Mammo Neg 08/2021.  BMI 25.81. Bone Density Osteopenia in 2022.  The patient exercises on a regular basis.  On Vit D/Ca++.  Colonoscopy 2015. Health labs with Fam MD.   Past medical history,surgical history, family history and social history were all reviewed and documented in the EPIC chart.  Gynecologic History No LMP recorded. Patient is postmenopausal.  Obstetric History OB History  Gravida Para Term Preterm AB Living  SAB IAB Ectopic Multiple Live Births               # Outcome Date GA Lbr Len/2nd Weight Sex Delivery Anes PTL Lv  3 AB           2 Term           1 Term              ROS: A ROS was performed and pertinent positives and negatives are included in the history. GENERAL: No fevers or chills. HEENT: No change in vision, no earache, sore throat or sinus congestion. NECK: No pain or stiffness. CARDIOVASCULAR: No chest pain or pressure. No palpitations. PULMONARY: No shortness of breath, cough or wheeze. GASTROINTESTINAL: No abdominal pain, nausea, vomiting or diarrhea, melena or bright red blood per rectum. GENITOURINARY: No urinary frequency, urgency, hesitancy or dysuria. MUSCULOSKELETAL: No joint or muscle pain, no back pain, no recent trauma. DERMATOLOGIC: No rash, no itching, no lesions. ENDOCRINE: No polyuria, polydipsia, no heat or cold intolerance. No recent change in weight. HEMATOLOGICAL: No anemia or easy bruising or bleeding. NEUROLOGIC: No headache, seizures, numbness, tingling or weakness. PSYCHIATRIC: No depression, no loss of interest in  normal activity or change in sleep pattern.     Exam:   BP 116/64   Pulse 88   Ht 5' 7.25" (1.708 m)   Wt 166 lb (75.3 kg)   SpO2 97%   BMI 25.81 kg/m   Body mass index is 25.81 kg/m.  General appearance : Well developed well nourished female. No acute distress HEENT: Eyes: no retinal hemorrhage or exudates,  Neck supple, trachea midline, no carotid bruits, no thyroidmegaly Lungs: Clear to auscultation, no rhonchi or wheezes, or rib retractions  Heart: Regular rate and rhythm, no murmurs or gallops Breast:Examined in sitting and supine position were symmetrical in appearance, no palpable masses or tenderness,  no skin retraction, no nipple inversion, no nipple discharge, no skin discoloration, no axillary or supraclavicular lymphadenopathy Abdomen: no palpable masses or tenderness, no rebound or guarding Extremities: no edema or skin discoloration or tenderness  Pelvic: Vulva: Mild vulvar atrophy with minimal erythema             Vagina: No gross lesions or discharge  Cervix: No gross lesions or discharge.  Pap reflex done.  Uterus  AV, normal size, shape and consistency, non-tender and mobile  Adnexa  Without masses or tenderness  Anus: Normal   Assessment/Plan:  77 y.o. female for annual exam   1. Encounter for routine gynecological examination with Papanicolaou smear of cervix Postmenopausal/atrophic genital changes.  No  significant menopausal symptoms or any vaginal bleeding.  No pelvic pain. Lichen Sclerosis with vulvar itching controled on Clobetasol cream twice a week. Abstinent.  Pap neg 2018.  Pap reflex today.  Urine/BMs normal.  Breasts normal. Mammo Neg 08/2021.  BMI 25.81. Bone Density Osteopenia in 2022.  The patient exercises on a regular basis.  On Vit D/Ca++.  Colonoscopy 2015. Health labs with Fam MD. - Cytology - PAP( [place])  2. Postmenopause Postmenopausal/atrophic genital changes.  No significant menopausal symptoms or any vaginal bleeding.  No pelvic  pain.  3. Osteopenia of multiple sites one Density Osteopenia in 2022.  The patient exercises on a regular basis.  On Vit D/Ca++.   4. Lichen sclerosus et atrophicus of the vulva Lichen Sclerosis with vulvar itching controled on Clobetasol cream twice a week.   Other orders - clobetasol cream (TEMOVATE) 0.05 %; Apply 1 Application topically 2 (two) times a week.   Genia Del MD, 1:44 PM

## 2022-07-23 LAB — CYTOLOGY - PAP: Diagnosis: NEGATIVE

## 2022-11-07 IMAGING — CT CT CARDIAC CORONARY ARTERY CALCIUM SCORE
3 series · 14 of 20 positions shown, 16 images · non-contrast
Comparison: None.
COMPARISON: None.

Addendum:
EXAM:
OVER-READ INTERPRETATION  CT CHEST

The following report is an over-read performed by radiologist Dr.
Adena Doc [[email or web address]] on 12/15/2020. This over-read
does not include interpretation of cardiac or coronary anatomy or
pathology. The coronary calcium score interpretation by the
cardiologist is attached.
CLINICAL DATA: Cardiovascular Disease Risk stratification
Coronary Calcium Score
TECHNIQUE: A gated, non-contrast computed tomography scan of the heart was
performed using 3mm slice thickness. Axial images were analyzed on a
dedicated workstation. Calcium scoring of the coronary arteries was
performed using the Agatston method.

[Series 2: cascseq 2.0 sa36 70% (id) · axial · 0.39mm/px · z∈[-224,-144]mm · 4 of 68 slices shown]
[im 14/68  vessel]
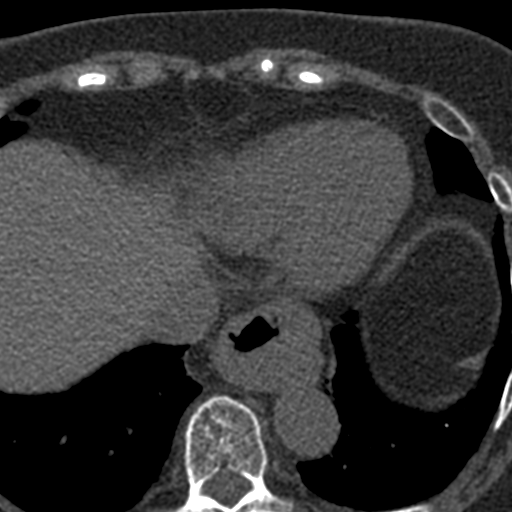
[im 27/68  vessel]
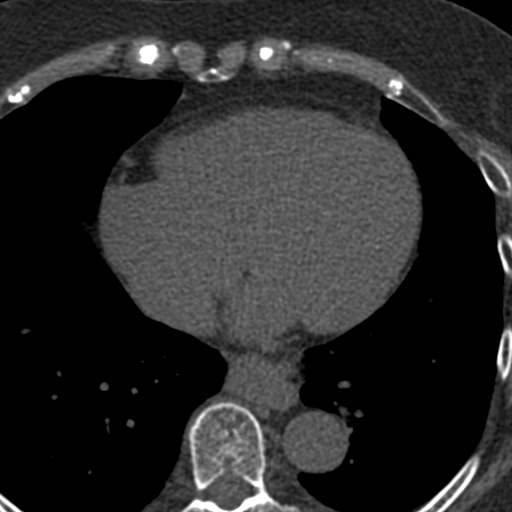
[im 41/68  vessel]
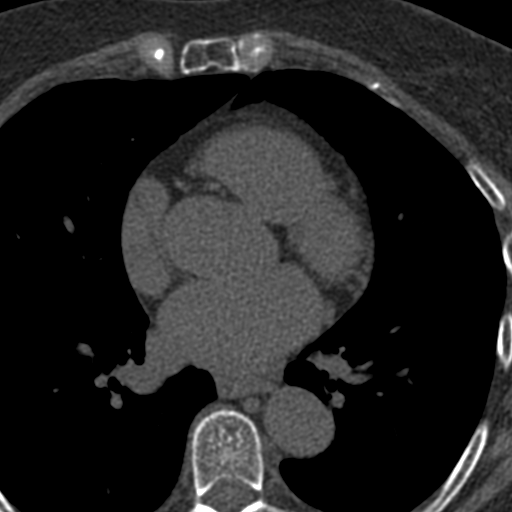
[im 54/68  vessel]
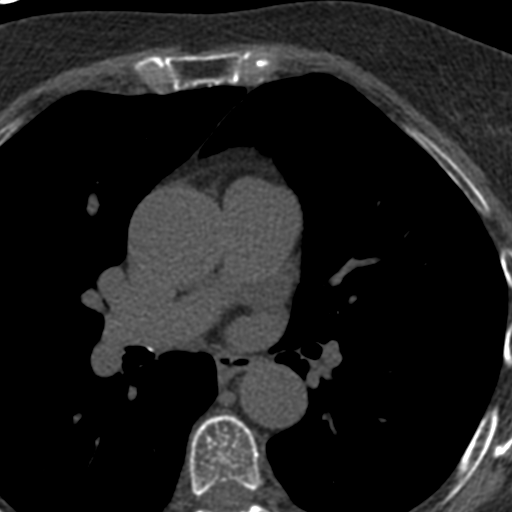

[Series 3: cascseq 2.0 bf37 st · axial · 0.62mm/px · z∈[-228,-140]mm · 5 of 68 slices shown, 7 images]
[im 12/68  vessel]
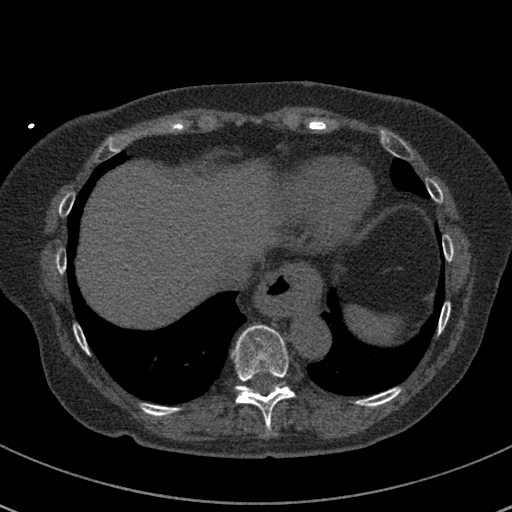
[im 12/68  lung]
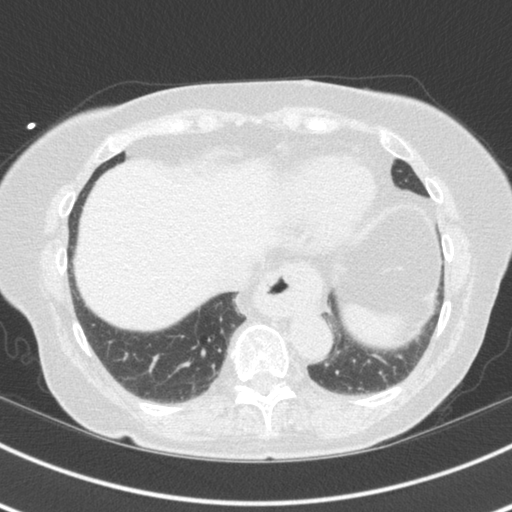
[im 23/68  vessel]
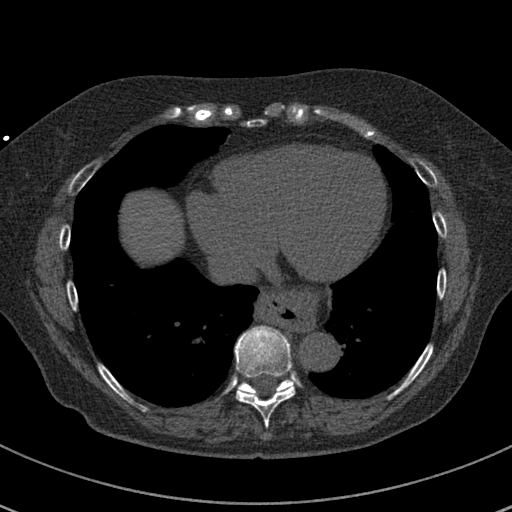
[im 34/68  vessel]
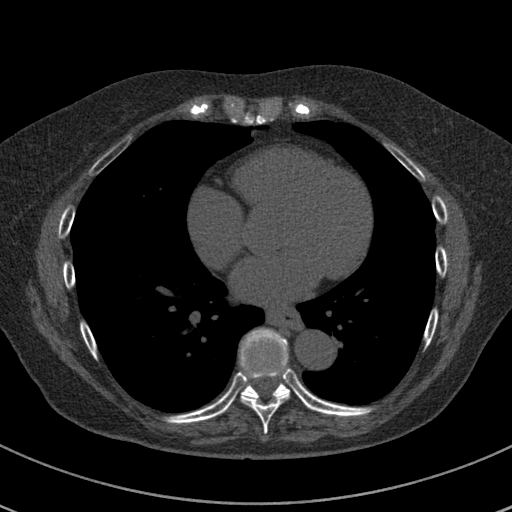
[im 45/68  vessel]
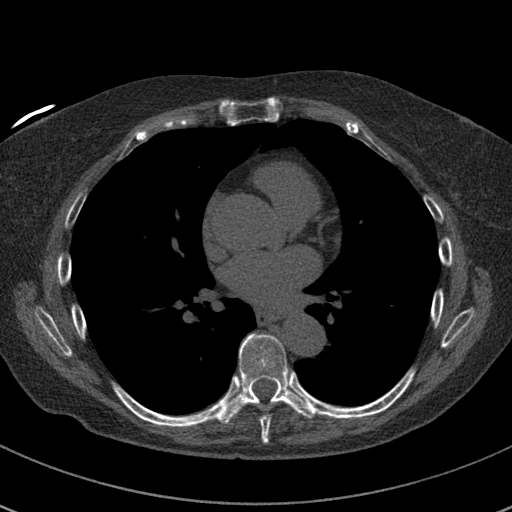
[im 56/68  vessel]
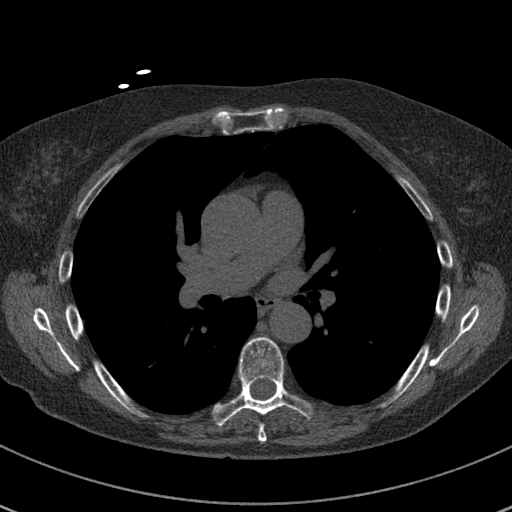
[im 56/68  lung]
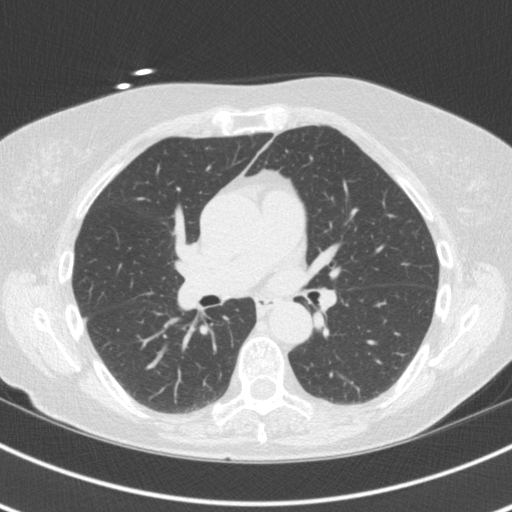

[Series 4: cascseq 2.0 br59 lung · axial · 0.62mm/px · z∈[-228,-140]mm · 5 of 68 slices shown]
[im 12/68  lung]
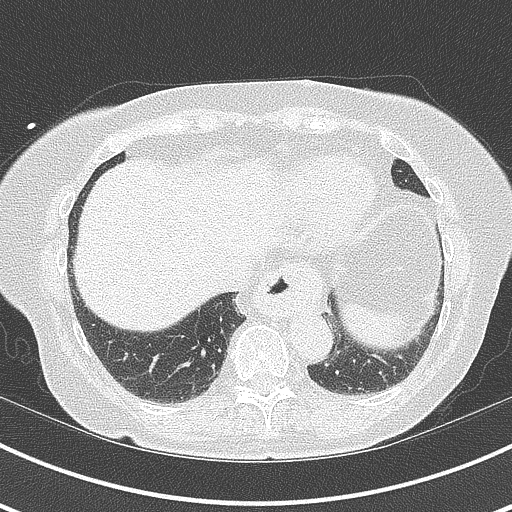
[im 23/68  lung]
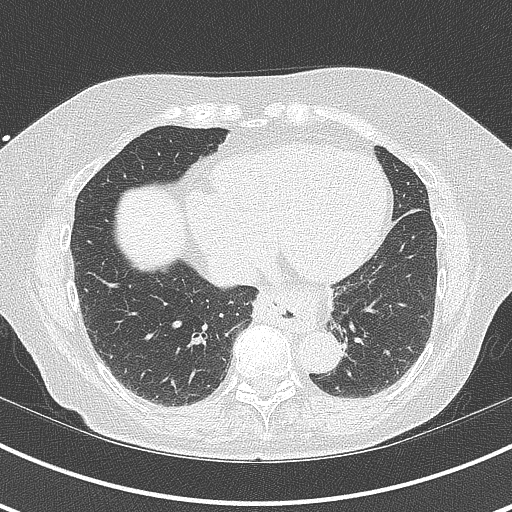
[im 34/68  lung]
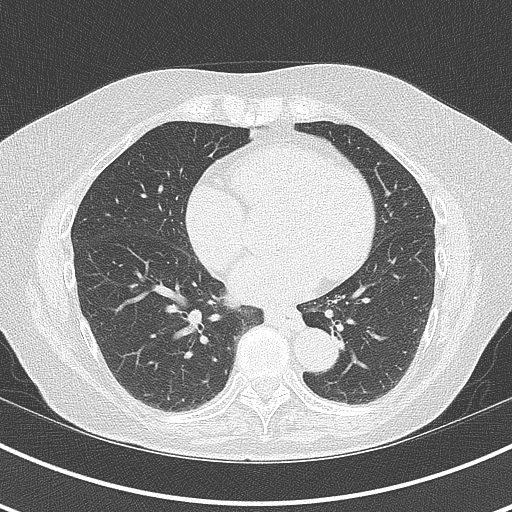
[im 45/68  lung]
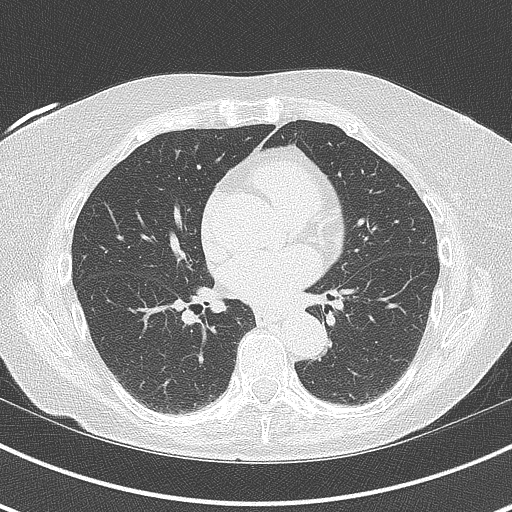
[im 56/68  lung]
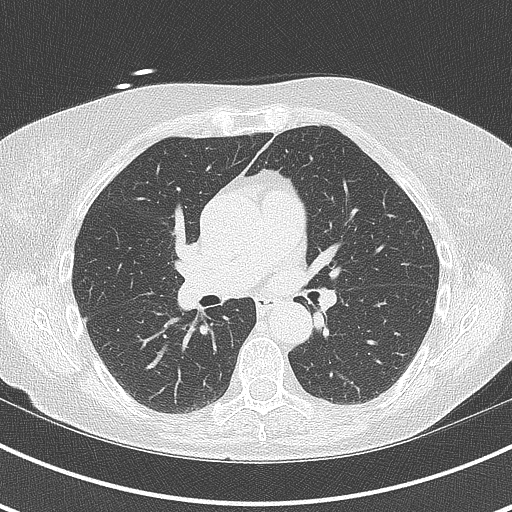

[14 of 20 positions shown; findings below may reference images not displayed]

FINDINGS: Vascular: Heart is normal size.  Aorta normal caliber.

Mediastinum/Nodes: No adenopathy

Lungs/Pleura: No confluent opacities or effusions.

Upper Abdomen: 17 mm low-density lesion in the left hepatic lobe,
likely cyst. Small to moderate-sized hiatal hernia.

Musculoskeletal: Chest wall soft tissues are unremarkable. No acute
bony abnormality.
IMPRESSION: No acute extra cardiac abnormality.

Small to moderate hiatal hernia.
FINDINGS: Coronary arteries: Normal origins.

Coronary Calcium Score:

Left main: 0

Left anterior descending artery: 0

Left circumflex artery: 0

Right coronary artery: 0

Total: 0

Percentile: 0

Pericardium: Normal.

Ascending Aorta: Normal caliber.

Non-cardiac: See separate report from [REDACTED].
IMPRESSION: Coronary calcium score of 0. This was 0 percentile for age-, race-,
and sex-matched controls.



If CAC=0, it is reasonable to withhold statin therapy and reassess
in 5 to 10 years, as long as higher risk conditions are absent
(diabetes mellitus, family history of premature CHD in first degree
relatives (males <55 years; females <65 years), cigarette smoking,
or LDL >=190 mg/dL).

If CAC is 1 to 99, it is reasonable to initiate statin therapy for
patients >=55 years of age.

If CAC is >=100 or >=75th percentile, it is reasonable to initiate
statin therapy at any age.

Cardiology referral should be considered for patients with CAC
scores >=400 or >=75th percentile.

*6102 AHA/ACC/AACVPR/AAPA/ABC/ERLINDA/JUMPER/ESFAHAN/Kaki/HADE/TINIO/SAYAN
Guideline on the Management of Blood Cholesterol: A Report of the
American College of Cardiology/American Heart Association Task Force
on Clinical Practice Guidelines. J Am Coll Cardiol.
8341;73(24):6658-6384.

*** End of Addendum ***
EXAM:
OVER-READ INTERPRETATION  CT CHEST

The following report is an over-read performed by radiologist Dr.
Adena Doc [REDACTED] on 12/15/2020. This over-read
does not include interpretation of cardiac or coronary anatomy or
pathology. The coronary calcium score interpretation by the
cardiologist is attached.
FINDINGS: Vascular: Heart is normal size.  Aorta normal caliber.

Mediastinum/Nodes: No adenopathy

Lungs/Pleura: No confluent opacities or effusions.

Upper Abdomen: 17 mm low-density lesion in the left hepatic lobe,
likely cyst. Small to moderate-sized hiatal hernia.

Musculoskeletal: Chest wall soft tissues are unremarkable. No acute
bony abnormality.
IMPRESSION: No acute extra cardiac abnormality.

Small to moderate hiatal hernia.

## 2024-01-08 ENCOUNTER — Encounter: Payer: Self-pay | Admitting: Gastroenterology

## 2024-01-26 ENCOUNTER — Encounter: Payer: Self-pay | Admitting: Gastroenterology

## 2024-02-20 ENCOUNTER — Encounter

## 2024-03-10 ENCOUNTER — Encounter: Admitting: Gastroenterology

## 2024-03-15 ENCOUNTER — Ambulatory Visit: Payer: Self-pay | Admitting: Gastroenterology

## 2024-03-15 ENCOUNTER — Ambulatory Visit: Admitting: Gastroenterology

## 2024-03-15 ENCOUNTER — Encounter: Payer: Self-pay | Admitting: Gastroenterology

## 2024-03-15 ENCOUNTER — Other Ambulatory Visit

## 2024-03-15 VITALS — BP 126/80 | HR 90 | Ht 67.25 in | Wt 168.0 lb

## 2024-03-15 DIAGNOSIS — D509 Iron deficiency anemia, unspecified: Secondary | ICD-10-CM | POA: Diagnosis not present

## 2024-03-15 DIAGNOSIS — Z860100 Personal history of colon polyps, unspecified: Secondary | ICD-10-CM

## 2024-03-15 DIAGNOSIS — K219 Gastro-esophageal reflux disease without esophagitis: Secondary | ICD-10-CM

## 2024-03-15 LAB — CBC WITH DIFFERENTIAL/PLATELET
Basophils Absolute: 0 K/uL (ref 0.0–0.1)
Basophils Relative: 0.6 % (ref 0.0–3.0)
Eosinophils Absolute: 0.2 K/uL (ref 0.0–0.7)
Eosinophils Relative: 2.8 % (ref 0.0–5.0)
HCT: 39.6 % (ref 36.0–46.0)
Hemoglobin: 13.1 g/dL (ref 12.0–15.0)
Lymphocytes Relative: 17.4 % (ref 12.0–46.0)
Lymphs Abs: 1.1 K/uL (ref 0.7–4.0)
MCHC: 33 g/dL (ref 30.0–36.0)
MCV: 82.9 fl (ref 78.0–100.0)
Monocytes Absolute: 0.4 K/uL (ref 0.1–1.0)
Monocytes Relative: 6.5 % (ref 3.0–12.0)
Neutro Abs: 4.6 K/uL (ref 1.4–7.7)
Neutrophils Relative %: 72.7 % (ref 43.0–77.0)
Platelets: 254 K/uL (ref 150.0–400.0)
RBC: 4.78 Mil/uL (ref 3.87–5.11)
RDW: 16.5 % — ABNORMAL HIGH (ref 11.5–15.5)
WBC: 6.3 K/uL (ref 4.0–10.5)

## 2024-03-15 LAB — BASIC METABOLIC PANEL WITH GFR
BUN: 16 mg/dL (ref 6–23)
CO2: 29 meq/L (ref 19–32)
Calcium: 9.4 mg/dL (ref 8.4–10.5)
Chloride: 104 meq/L (ref 96–112)
Creatinine, Ser: 0.81 mg/dL (ref 0.40–1.20)
GFR: 68.39 mL/min (ref >60.00)
Glucose, Bld: 109 mg/dL — ABNORMAL HIGH (ref 70–99)
Potassium: 3.9 meq/L (ref 3.5–5.1)
Sodium: 140 meq/L (ref 135–145)

## 2024-03-15 MED ORDER — NA SULFATE-K SULFATE-MG SULF 17.5-3.13-1.6 GM/177ML PO SOLN: 1.0000 | Freq: Once | ORAL | 0 refills | Status: AC

## 2024-03-15 NOTE — Progress Notes (Signed)
 Debra Frederick [id number] June 08, 1945   Chief Complaint: Anemia, positive Cologuard  Referring Provider: Valentin Skates, DO Primary GI MD: Sampson (previous Dr. Obie)  HPI: Debra Frederick is a 78 y.o. female with past medical history of skin cancer, osteopenia, GERD, hiatal hernia who presents today for evaluation of anemia and positive Cologuard test.    Patient referred by PCP for colonoscopy due to having positive Cologuard test.  Last colonoscopy 07/16/2013 with 1 hyperplastic polyp removed and otherwise normal 10-year recall recommended.  Is on iron supplement per chart review.   Recent labs not available for review.    Discussed the use of AI scribe software for clinical note transcription with the patient, who gave verbal consent to proceed.  History of Present Illness Debra Frederick is an 78 year old female who presents with a positive Cologuard test and iron deficiency anemia. She was referred by her primary care physician due to a positive Cologuard test.  Colorectal cancer screening and gastrointestinal symptoms - Positive Cologuard test prompted referral from primary care physician. - Colonoscopy in 2015 revealed one benign polyp; all other findings were normal. - No history of upper endoscopy. - No abdominal pain, abnormal bowel movements, or hematochezia. - Regular bowel movements without constipation. - Iron supplementation has altered stool color.  Iron deficiency anemia - Diagnosed in October. - Taking daily iron supplement since diagnosis. - Improved energy since starting iron supplementation. - Iron levels have historically been borderline low.  Gastroesophageal reflux disease (gerd) symptoms - History of acid reflux and nocturnal heartburn, attributed to eating close to bedtime due to part-time job. - Uses famotidine as needed, primarily at night. - No daytime symptoms unless consuming acidic foods. - No dysphagia, weight loss, or significant  changes in weight.  Cardiac symptoms - History of palpitations, previously evaluated by cardiology and attributed to stress. - Cardiac evaluation included calcium score test, which was zero. - No chest pain or shortness of breath.  Musculoskeletal symptoms - Occasional use of anti-inflammatory medications for back pain related to scoliosis. - Not taken on a regular basis.  Family history of gastrointestinal malignancy - No family history of esophageal, stomach, or colon cancer.   Previous GI Procedures/Imaging   Colonoscopy 07/16/2013 Diminutive sessile polyp found in the sigmoid colon, polypectomy was performed with cold forceps Recall 10 years Path: Surgical Surgical [P], sigmoid, sigmoid, polyp - HYPERPLASTIC HYPERPLASTIC POLYP(S). POLYP(S). NO ADENOMATOUS ADENOMATOUS CHANGE OR MALIGNANCY MALIGNANCY IDENTIFIED.   Past Medical History:  Diagnosis Date   Bursitis    Cancer (HCC)    basal cell CA x2- thigh and left chest-90's   Cataract    History of shingles 2010   Osteopenia 10/2017   T score -2.2 FRAX 14% / 3.6%    Past Surgical History:  Procedure Laterality Date   basal cell excised     BREAST LUMPECTOMY     Benign   CATARACT EXTRACTION     COLONOSCOPY     DILATION AND CURETTAGE OF UTERUS     macular eye surg     TUBAL LIGATION      Current Outpatient Medications  Medication Sig Dispense Refill   ascorbic acid (VITAMIN C) 1000 MG tablet 1 tablet     calcium carbonate (OSCAL) 1500 (600 Ca) MG TABS tablet 1 tablet with meals     Cholecalciferol (VITAMIN D3) 125 MCG (5000 UT) CAPS Take 5,000 Units by mouth daily.     clobetasol  cream (TEMOVATE ) 0.05 % Apply 1 Application  topically 2 (two) times a week. 30 g 5   famotidine (PEPCID) 10 MG tablet 1 tablet as needed     fish oil-omega-3 fatty acids 1000 MG capsule Take 1 g by mouth daily. Reported on 09/22/2015     Flaxseed, Linseed, (FLAX SEED OIL PO) Take 1,000 mg by mouth daily.      sertraline (ZOLOFT) 50 MG  tablet TAKE 1/2 TABLET EVERY DAY FOR TWO WEEKS AND AS TOLERATED INCREASE TO FULL TABLET.     No current facility-administered medications for this visit.    Allergies as of 03/15/2024 - Review Complete 03/15/2024  Allergen Reaction Noted   Codeine Other (See Comments) 06/22/2013   Penicillins Rash 10/22/2011    Family History  Problem Relation Age of Onset   Breast cancer Mother        Age 6   Liver cancer Father    CAD Father    Stroke Maternal Grandmother    Colon polyps Neg Hx    Esophageal cancer Neg Hx    Rectal cancer Neg Hx    Stomach cancer Neg Hx     Social History[1]   Review of Systems:    Constitutional: No significant weight loss. No fever, chills, weakness Cardiovascular: No chest pain Respiratory: No SOB  Gastrointestinal: See HPI and otherwise negative   Physical Exam:  Vital signs: BP 126/80   Pulse 90   Ht 5' 7.25 (1.708 m)   Wt 168 lb (76.2 kg)   BMI 26.12 kg/m   Constitutional: Pleasant, well-appearing female in NAD, appears younger than stated age, alert and cooperative Head:  Normocephalic and atraumatic.  Respiratory: Respirations even and unlabored. Lungs clear to auscultation bilaterally.  No wheezes, crackles, or rhonchi.  Cardiovascular:  Regular rate and rhythm. No murmurs. No peripheral edema. Gastrointestinal:  Soft, nondistended, nontender. No rebound or guarding. Normal bowel sounds. No appreciable masses or hepatomegaly. Rectal:  Not performed.  Neurologic:  Alert and oriented x4;  grossly normal neurologically.  Skin:   Dry and intact without significant lesions or rashes. Psychiatric: Oriented to person, place and time. Demonstrates good judgement and reason without abnormal affect or behaviors.   RELEVANT LABS AND IMAGING: CBC    Component Value Date/Time   WBC 4.7 11/01/2017 1427   WBC 5.1 10/19/2015 0855   WBC 4.2 10/13/2014 0828   RBC 4.93 11/01/2017 1427   RBC 4.84 10/19/2015 0855   RBC 5.02 10/13/2014 0828   HGB  14.1 11/01/2017 1427   HCT 43.8 11/01/2017 1427   PLT 217 11/01/2017 1427   MCV 89 11/01/2017 1427   MCH 28.6 11/01/2017 1427   MCH 29.4 10/19/2015 0855   MCH 28.1 10/13/2014 0828   MCHC 32.2 11/01/2017 1427   MCHC 34.5 10/19/2015 0855   MCHC 33.7 10/13/2014 0828   RDW 14.5 11/01/2017 1427   LYMPHSABS 1.7 10/13/2014 0828   MONOABS 0.3 10/13/2014 0828   EOSABS 0.1 10/13/2014 0828   BASOSABS 0.1 10/13/2014 0828    CMP     Component Value Date/Time   NA 138 11/01/2017 1427   K 3.8 11/01/2017 1427   CL 101 11/01/2017 1427   CO2 22 11/01/2017 1427   GLUCOSE 102 (H) 11/01/2017 1427   GLUCOSE 98 10/19/2015 0852   BUN 16 11/01/2017 1427   CREATININE 0.89 11/01/2017 1427   CREATININE 0.89 10/19/2015 0852   CALCIUM 9.7 11/01/2017 1427   PROT 7.0 11/01/2017 1427   ALBUMIN 4.6 11/01/2017 1427   AST 18 11/01/2017 1427  ALT 14 11/01/2017 1427   ALKPHOS 69 11/01/2017 1427   BILITOT 0.4 11/01/2017 1427   GFRNONAA 64 11/01/2017 1427   GFRNONAA 65 10/19/2015 0852   GFRAA 74 11/01/2017 1427   GFRAA 75 10/19/2015 0852     Assessment/Plan:   Assessment & Plan Evaluation of positive Cologuard test Iron deficiency anemia Gastroesophageal reflux disease Positive Cologuard test requires further evaluation to exclude colorectal cancer or other GI pathology. Last colonoscopy 2015 with 1 hyperplastic polyp and 10-year recall recommended. Also has recent diagnosis of IDA. Iron supplementation improved energy levels. Has history of GERD with primarily nocturnal symptoms managed with famotidine as needed.  No prior EGD.  - Schedule EGD/colonoscopy. I thoroughly discussed the procedure with the patient to include nature of the procedure, alternatives, benefits, and risks (including but not limited to bleeding, infection, perforation, anesthesia/cardiac/pulmonary complications). Patient verbalized understanding and gave verbal consent to proceed with procedure.  - Labs today: CBC, BMP -  Continue famotidine as needed    Camie Furbish, PA-C [facility] 03/15/2024, 2:09 PM  Patient Care Team: Valentin Skates, DO as PCP - General (Internal Medicine) Joshua Sieving, MD as Consulting Physician (Dermatology)       [1]  Social History Tobacco Use   Smoking status: Never   Smokeless tobacco: Never  Vaping Use   Vaping status: Never Used  Substance Use Topics   Alcohol use: Not Currently   Drug use: No

## 2024-03-15 NOTE — Patient Instructions (Signed)
 Your provider has requested that you go to the basement level for lab work before leaving today. Press B on the elevator. The lab is located at the first door on the left as you exit the elevator.  You have been scheduled for an endoscopy and colonoscopy. Please follow the written instructions given to you at your visit today.  If you use inhalers (even only as needed), please bring them with you on the day of your procedure.  DO NOT TAKE 7 DAYS PRIOR TO TEST- Trulicity (dulaglutide) Ozempic, Wegovy (semaglutide) Mounjaro, Zepbound (tirzepatide) Bydureon Bcise (exanatide extended release)  DO NOT TAKE 1 DAY PRIOR TO YOUR TEST Rybelsus (semaglutide) Adlyxin (lixisenatide) Victoza (liraglutide) Byetta (exanatide) ___________________________________________________________________________

## 2024-03-17 NOTE — Progress Notes (Signed)
 ____________________________________________________________  Attending physician addendum:  Thank you for sending this case to me. I have reviewed the entire note and agree with the plan.   Victory Brand, MD  ____________________________________________________________

## 2024-04-14 ENCOUNTER — Encounter: Payer: Self-pay | Admitting: Gastroenterology

## 2024-04-21 ENCOUNTER — Encounter: Payer: Self-pay | Admitting: Gastroenterology

## 2024-04-21 ENCOUNTER — Ambulatory Visit: Admitting: Gastroenterology

## 2024-04-21 VITALS — BP 131/96 | HR 66 | Temp 97.3°F | Resp 9 | Ht 67.25 in | Wt 168.0 lb

## 2024-04-21 DIAGNOSIS — Q399 Congenital malformation of esophagus, unspecified: Secondary | ICD-10-CM | POA: Diagnosis not present

## 2024-04-21 DIAGNOSIS — R195 Other fecal abnormalities: Secondary | ICD-10-CM

## 2024-04-21 DIAGNOSIS — D120 Benign neoplasm of cecum: Secondary | ICD-10-CM | POA: Diagnosis not present

## 2024-04-21 DIAGNOSIS — K269 Duodenal ulcer, unspecified as acute or chronic, without hemorrhage or perforation: Secondary | ICD-10-CM

## 2024-04-21 DIAGNOSIS — K2980 Duodenitis without bleeding: Secondary | ICD-10-CM

## 2024-04-21 DIAGNOSIS — K3189 Other diseases of stomach and duodenum: Secondary | ICD-10-CM

## 2024-04-21 DIAGNOSIS — D509 Iron deficiency anemia, unspecified: Secondary | ICD-10-CM

## 2024-04-21 DIAGNOSIS — K449 Diaphragmatic hernia without obstruction or gangrene: Secondary | ICD-10-CM | POA: Diagnosis not present

## 2024-04-21 DIAGNOSIS — K2960 Other gastritis without bleeding: Secondary | ICD-10-CM

## 2024-04-21 DIAGNOSIS — K219 Gastro-esophageal reflux disease without esophagitis: Secondary | ICD-10-CM

## 2024-04-21 DIAGNOSIS — K2950 Unspecified chronic gastritis without bleeding: Secondary | ICD-10-CM

## 2024-04-21 MED ORDER — SODIUM CHLORIDE 0.9 % IV SOLN: 500.0000 mL | INTRAVENOUS | Status: DC

## 2024-04-21 NOTE — Progress Notes (Signed)
 History and Physical:  This patient presents for endoscopic testing for: Encounter Diagnoses  Name Primary?   Gastroesophageal reflux disease, unspecified whether esophagitis present Yes   Iron deficiency anemia, unspecified iron deficiency anemia type    Positive colorectal cancer screening using Cologuard test     79 year old woman here today for endoscopic evaluation of under lying iron deficiency anemia, GERD symptoms and a recently positive Cologuard test ordered by primary care. Further clinical details of this are outlined in the 03/15/2024 APP office note. Last colonoscopy in 2015 with a hyperplastic polyp.   She has no localizing GI symptoms today, and reports that the iron deficiency anemia was discovered several months ago at a routine visit with primary care. Patient is otherwise without complaints or active issues today.   Past Medical History: Past Medical History:  Diagnosis Date   Anemia    Anxiety    Bursitis    Cancer (HCC)    basal cell CA x2- thigh and left chest-90's   Cataract    GERD (gastroesophageal reflux disease)    History of shingles 2010   Osteopenia 10/2017   T score -2.2 FRAX 14% / 3.6%     Past Surgical History: Past Surgical History:  Procedure Laterality Date   basal cell excised     BREAST LUMPECTOMY     Benign   CATARACT EXTRACTION     COLONOSCOPY  2015   hyperplastic polyps   DILATION AND CURETTAGE OF UTERUS     macular eye surg     TUBAL LIGATION      Allergies: Allergies[1]  Outpatient Meds: Current Outpatient Medications  Medication Sig Dispense Refill   ascorbic acid (VITAMIN C) 1000 MG tablet 1 tablet     calcium carbonate (OSCAL) 1500 (600 Ca) MG TABS tablet 1 tablet with meals     Cholecalciferol (VITAMIN D3) 125 MCG (5000 UT) CAPS Take 5,000 Units by mouth daily.     clobetasol  cream (TEMOVATE ) 0.05 % Apply 1 Application topically 2 (two) times a week. 30 g 5   famotidine (PEPCID) 10 MG tablet 1 tablet as needed      ferrous sulfate 325 (65 FE) MG tablet 1 tablet Orally daily; Duration: 90 days with a meal     ibandronate (BONIVA) 150 MG tablet Take 150 mg by mouth daily.     sertraline (ZOLOFT) 50 MG tablet TAKE 1/2 TABLET EVERY DAY FOR TWO WEEKS AND AS TOLERATED INCREASE TO FULL TABLET.     fish oil-omega-3 fatty acids 1000 MG capsule Take 1 g by mouth daily. Reported on 09/22/2015     Flaxseed, Linseed, (FLAX SEED OIL PO) Take 1,000 mg by mouth daily.      Current Facility-Administered Medications  Medication Dose Route Frequency Provider Last Rate Last Admin   0.9 %  sodium chloride  infusion  500 mL Intravenous Continuous Danis, Victory CROME III, MD          ___________________________________________________________________ Objective   Exam:  BP (!) 124/92   Pulse 88   Temp (!) 97.3 F (36.3 C) (Temporal)   Ht 5' 7.25 (1.708 m)   Wt 168 lb (76.2 kg)   SpO2 97%   BMI 26.12 kg/m   CV: regular , S1/S2 Resp: clear to auscultation bilaterally, normal RR and effort noted GI: soft, no tenderness, with active bowel sounds.   Assessment: Encounter Diagnoses  Name Primary?   Gastroesophageal reflux disease, unspecified whether esophagitis present Yes   Iron deficiency anemia, unspecified iron deficiency anemia  type    Positive colorectal cancer screening using Cologuard test      Plan: Colonoscopy EGD  The benefits and risks of the planned procedure(s) were described in detail with the patient or (when appropriate) their health care proxy.  Risks were outlined as including, but not limited to, bleeding, infection, perforation, adverse medication reaction leading to cardiac or pulmonary decompensation, pancreatitis (if ERCP).  The limitation of incomplete mucosal visualization was also discussed.  No guarantees or warranties were given.  The patient was provided an opportunity to ask questions and all were answered. The patient agreed with the plan.   The patient is appropriate for an  endoscopic procedure in the ambulatory setting.   - Victory Brand, MD        [1]  Allergies Allergen Reactions   Codeine Other (See Comments)    Jittery   Penicillins Rash

## 2024-04-21 NOTE — Op Note (Signed)
 [facility] Patient Name: Debra Frederick Procedure Date: 04/21/2024 8:46 AM MRN: [id number] Endoscopist: Victory L. Legrand , MD, [id number] Age: 79 Referring MD:  Date of Birth: 08/28/1945 Gender: Female Account #: [id number] Procedure:                Upper GI endoscopy Indications:              Unexplained iron deficiency anemia, Esophageal                            reflux                           Other than intermittent reflux symptoms, no                            localizing GI symptoms related to IDA                           Patient takes as needed OTC famotidine, no aspirin                            or NSAID use Medicines:                Monitored Anesthesia Care Procedure:                Pre-Anesthesia Assessment:                           - Prior to the procedure, a History and Physical                            was performed, and patient medications and                            allergies were reviewed. The patient's tolerance of                            previous anesthesia was also reviewed. The risks                            and benefits of the procedure and the sedation                            options and risks were discussed with the patient.                            All questions were answered, and informed consent                            was obtained. Prior Anticoagulants: The patient has                            taken no anticoagulant or antiplatelet agents. ASA  Grade Assessment: II - A patient with mild systemic                            disease. After reviewing the risks and benefits,                            the patient was deemed in satisfactory condition to                            undergo the procedure.                           After obtaining informed consent, the endoscope was                            passed under direct vision. Throughout the                            procedure, the patient's blood  pressure, pulse, and                            oxygen saturations were monitored continuously. The                            Olympus Scope [id number] was introduced through the                            mouth, and advanced to the second part of duodenum.                            The upper GI endoscopy was accomplished without                            difficulty. The patient tolerated the procedure                            well. Scope In: Scope Out: Findings:                 A 6 cm hiatal hernia was present.                           The lower third of the esophagus was somewhat                            tortuous (due to the anatomic distortion from the                            hiatal hernia). EGJ at 34 centimeters from                            incisors. Mildly stenotic EGJ, scope able to pass  easily, tissue they are somewhat friable                           A few diminutive erosions with stigmata of recent                            bleeding were found in the gastric fundus.                            Cameron erosions with scant flecks of hematin                           Patchy mild inflammation characterized by adherent                            blood (flecks of hematin) and erythema was found in                            the gastric antrum. Biopsies were taken with a cold                            forceps for histology. (Antrum and body in same                            pathology jar to rule out H. pylori)                           The cardia and gastric fundus were normal on                            retroflexion.                           A few erosions without bleeding were found in the                            duodenal bulb.                           The exam of the duodenum was otherwise normal. Complications:            No immediate complications. Estimated Blood Loss:     Estimated blood loss was minimal. Impression:                - 6 cm hiatal hernia.                           - Tortuous esophagus (related to hiatal hernia).                           - Erosive gastropathy with stigmata of recent                            bleeding.                           -  Gastritis. Biopsied. (Mild and nonspecific)                           - Duodenal erosions without bleeding. Mild and                            nonspecific.                           Gastric erosions within the herniated portion of                            the stomach are common cause of iron deficiency                            anemia. Recommendation:           - Patient has a contact number available for                            emergencies. The signs and symptoms of potential                            delayed complications were discussed with the                            patient. Return to normal activities tomorrow.                            Written discharge instructions were provided to the                            patient.                           - Resume previous diet.                           - Continue present medications. However, I                            recommend changing as needed famotidine to OTC                            omeprazole 20 mg every other day indefinitely. This                            will decrease the chance of reflux related                            inflammation and stenosis at the EG junction.                           -Keep head of bed elevated when sleeping to  decrease reflux                           -Follow-up with primary care for management of the                            iron deficiency anemia                           - Await pathology results.                           - See the other procedure note for documentation of                            additional recommendations. Martyn Timme L. Legrand, MD 04/21/2024 9:35:26 AM This report has been signed electronically.

## 2024-04-21 NOTE — Op Note (Signed)
 [facility] Patient Name: Debra Frederick Procedure Date: 04/21/2024 8:47 AM MRN: [id number] Endoscopist: Victory L. Legrand , MD, [id number] Age: 79 Referring MD:  Date of Birth: 1945/10/30 Gender: Female Account #: [id number] Procedure:                Colonoscopy Indications:              Unexplained iron deficiency anemia, Positive                            Cologuard test Medicines:                Monitored Anesthesia Care Procedure:                Pre-Anesthesia Assessment:                           - Prior to the procedure, a History and Physical                            was performed, and patient medications and                            allergies were reviewed. The patient's tolerance of                            previous anesthesia was also reviewed. The risks                            and benefits of the procedure and the sedation                            options and risks were discussed with the patient.                            All questions were answered, and informed consent                            was obtained. Prior Anticoagulants: The patient has                            taken no anticoagulant or antiplatelet agents. ASA                            Grade Assessment: II - A patient with mild systemic                            disease. After reviewing the risks and benefits,                            the patient was deemed in satisfactory condition to                            undergo the procedure.  After obtaining informed consent, the colonoscope                            was passed under direct vision. Throughout the                            procedure, the patient's blood pressure, pulse, and                            oxygen saturations were monitored continuously. The                            CF [id number] #[id number] was introduced through the anus                            and advanced to the the terminal ileum, with                             identification of the appendiceal orifice and IC                            valve. The colonoscopy was performed without                            difficulty. The patient tolerated the procedure                            well. The quality of the bowel preparation was                            excellent. The terminal ileum, ileocecal valve,                            appendiceal orifice, and rectum were photographed. Scope In: 8:58:03 AM Scope Out: 9:10:48 AM Scope Withdrawal Time: 0 hours 9 minutes 40 seconds  Total Procedure Duration: 0 hours 12 minutes 45 seconds  Findings:                 The perianal and digital rectal examinations were                            normal.                           The terminal ileum appeared normal.                           A diminutive polyp was found in the cecum. The                            polyp was sessile. The polyp was removed with a                            cold snare. Resection and retrieval were complete.  The exam was otherwise without abnormality on                            direct and retroflexion views. Complications:            No immediate complications. Estimated Blood Loss:     Estimated blood loss was minimal. Impression:               - The examined portion of the ileum was normal.                           - One diminutive polyp in the cecum, removed with a                            cold snare. Resected and retrieved.                           - The examination was otherwise normal on direct                            and retroflexion views. Recommendation:           - Patient has a contact number available for                            emergencies. The signs and symptoms of potential                            delayed complications were discussed with the                            patient. Return to normal activities tomorrow.                            Written  discharge instructions were provided to the                            patient.                           - Resume previous diet.                           - Continue present medications.                           - Await pathology results.                           - No repeat colonoscopy due to age, current                            guidelines and low risk findings today.                           - See the other procedure note for documentation of  additional recommendations. Sriman Tally L. Legrand, MD 04/21/2024 9:25:32 AM This report has been signed electronically.

## 2024-04-21 NOTE — Progress Notes (Signed)
Updated medical record with pt

## 2024-04-21 NOTE — Progress Notes (Signed)
 Vss nad trans to pacu

## 2024-04-21 NOTE — Progress Notes (Signed)
 Called to room to assist during endoscopic procedure.  Patient ID and intended procedure confirmed with present staff. Received instructions for my participation in the procedure from the performing physician.

## 2024-04-21 NOTE — Patient Instructions (Addendum)
 Handouts given on GERD, gastritis and polyps.  Encouraged to follow GERD lifestyle changes and take otc omeprazole 20 mg every other day.  YOU HAD AN ENDOSCOPIC PROCEDURE TODAY AT THE [facility]:   Refer to the procedure report that was given to you for any specific questions about what was found during the examination.  If the procedure report does not answer your questions, please call your gastroenterologist to clarify.  If you requested that your care partner not be given the details of your procedure findings, then the procedure report has been included in a sealed envelope for you to review at your convenience later.  YOU SHOULD EXPECT: Some feelings of bloating in the abdomen. Passage of more gas than usual.  Walking can help get rid of the air that was put into your GI tract during the procedure and reduce the bloating. If you had a lower endoscopy (such as a colonoscopy or flexible sigmoidoscopy) you may notice spotting of blood in your stool or on the toilet paper. If you underwent a bowel prep for your procedure, you may not have a normal bowel movement for a few days.  Please Note:  You might notice some irritation and congestion in your nose or some drainage.  This is from the oxygen used during your procedure.  There is no need for concern and it should clear up in a day or so.  SYMPTOMS TO REPORT IMMEDIATELY:  Following lower endoscopy (colonoscopy or flexible sigmoidoscopy):  Excessive amounts of blood in the stool  Significant tenderness or worsening of abdominal pains  Swelling of the abdomen that is new, acute  Fever of 100F or higher  Following upper endoscopy (EGD)  Vomiting of blood or coffee ground material  New chest pain or pain under the shoulder blades  Painful or persistently difficult swallowing  New shortness of breath  Fever of 100F or higher  Black, tarry-looking stools  For urgent or emergent issues, a gastroenterologist can be reached at any  hour by calling [phone]. Do not use MyChart messaging for urgent concerns.    DIET:  We do recommend a small meal at first, but then you may proceed to your regular diet.  Drink plenty of fluids but you should avoid alcoholic beverages for 24 hours.  ACTIVITY:  You should plan to take it easy for the rest of today and you should NOT DRIVE or use heavy machinery until tomorrow (because of the sedation medicines used during the test).    FOLLOW UP: Our staff will call the number listed on your records the next business day following your procedure.  We will call around 7:15- 8:00 am to check on you and address any questions or concerns that you may have regarding the information given to you following your procedure. If we do not reach you, we will leave a message.     If any biopsies were taken you will be contacted by phone or by letter within the next 1-3 weeks.  Please call us  at [phone] if you have not heard about the biopsies in 3 weeks.    SIGNATURES/CONFIDENTIALITY: You and/or your care partner have signed paperwork which will be entered into your electronic medical record.  These signatures attest to the fact that that the information above on your After Visit Summary has been reviewed and is understood.  Full responsibility of the confidentiality of this discharge information lies with you and/or your care-partner.

## 2024-04-22 ENCOUNTER — Telehealth: Payer: Self-pay | Admitting: *Deleted

## 2024-04-22 NOTE — Telephone Encounter (Signed)
" °  Follow up Call-     04/21/2024    7:44 AM  Call back number  Post procedure Call Back phone  # [phone]  Permission to leave phone message Yes     Patient questions:  Do you have a fever, pain , or abdominal swelling? No. Pain Score  0 *  Have you tolerated food without any problems? Yes.    Have you been able to return to your normal activities? Yes.    Do you have any questions about your discharge instructions: Diet   No. Medications  No. Follow up visit  No.  Do you have questions or concerns about your Care? No.  Actions: * If pain score is 4 or above: No action needed, pain <4.   "

## 2024-04-26 LAB — SURGICAL PATHOLOGY

## 2024-04-28 ENCOUNTER — Ambulatory Visit: Payer: Self-pay | Admitting: Gastroenterology
